# Patient Record
Sex: Female | Born: 1990 | Race: Black or African American | Hispanic: No | Marital: Married | Smoking: Never smoker
Health system: Southern US, Community
[De-identification: ages and names within clinical notes are randomized; demographics above are authoritative.]

## PROBLEM LIST (undated history)

## (undated) ENCOUNTER — Inpatient Hospital Stay (HOSPITAL_COMMUNITY): Payer: Self-pay

## (undated) DIAGNOSIS — N83209 Unspecified ovarian cyst, unspecified side: Secondary | ICD-10-CM

## (undated) DIAGNOSIS — O1490 Unspecified pre-eclampsia, unspecified trimester: Secondary | ICD-10-CM

## (undated) DIAGNOSIS — O139 Gestational [pregnancy-induced] hypertension without significant proteinuria, unspecified trimester: Secondary | ICD-10-CM

## (undated) DIAGNOSIS — A749 Chlamydial infection, unspecified: Secondary | ICD-10-CM

## (undated) DIAGNOSIS — B999 Unspecified infectious disease: Secondary | ICD-10-CM

## (undated) HISTORY — DX: Unspecified pre-eclampsia, unspecified trimester: O14.90

## (undated) HISTORY — PX: WISDOM TOOTH EXTRACTION: SHX21

---
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
## 2009-10-16 ENCOUNTER — Emergency Department (HOSPITAL_COMMUNITY): Admission: EM | Admit: 2009-10-16 | Discharge: 2009-10-16 | Payer: Self-pay | Admitting: Emergency Medicine

## 2009-10-28 ENCOUNTER — Emergency Department (HOSPITAL_COMMUNITY): Admission: EM | Admit: 2009-10-28 | Discharge: 2009-10-28 | Payer: Self-pay | Admitting: Emergency Medicine

## 2010-03-08 ENCOUNTER — Emergency Department (HOSPITAL_COMMUNITY): Admission: EM | Admit: 2010-03-08 | Discharge: 2010-03-08 | Payer: Self-pay | Admitting: Emergency Medicine

## 2010-04-29 ENCOUNTER — Emergency Department (HOSPITAL_COMMUNITY): Admission: EM | Admit: 2010-04-29 | Discharge: 2010-04-29 | Payer: Self-pay | Admitting: Family Medicine

## 2010-04-30 ENCOUNTER — Emergency Department (HOSPITAL_COMMUNITY): Admission: EM | Admit: 2010-04-30 | Discharge: 2010-04-30 | Payer: Self-pay | Admitting: Emergency Medicine

## 2010-05-17 ENCOUNTER — Emergency Department (HOSPITAL_COMMUNITY): Admission: EM | Admit: 2010-05-17 | Discharge: 2010-05-17 | Payer: Self-pay | Admitting: Family Medicine

## 2010-05-27 ENCOUNTER — Emergency Department (HOSPITAL_COMMUNITY): Admission: EM | Admit: 2010-05-27 | Discharge: 2010-05-27 | Payer: Self-pay | Admitting: Emergency Medicine

## 2010-08-21 ENCOUNTER — Emergency Department (HOSPITAL_COMMUNITY): Admission: EM | Admit: 2010-08-21 | Discharge: 2010-08-21 | Payer: Self-pay | Admitting: Emergency Medicine

## 2010-08-31 IMAGING — CT CT HEAD W/O CM
1 series · 16 of 30 positions shown, 20 images · non-contrast
Comparison: None available.

CLINICAL DATA: Headache.

CT HEAD WITHOUT CONTRAST
TECHNIQUE: Contiguous axial images were obtained from the base of
the skull through the vertex without contrast.

[Series 2: head routine 4.8 h37s · axial · 0.42mm/px · z∈[-170,-35]mm · 16 of 30 slices shown, 20 images]
[im 2/30  brain]
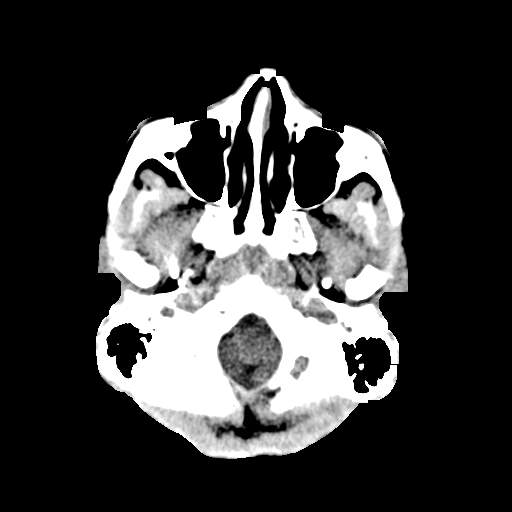
[im 2/30  bone]
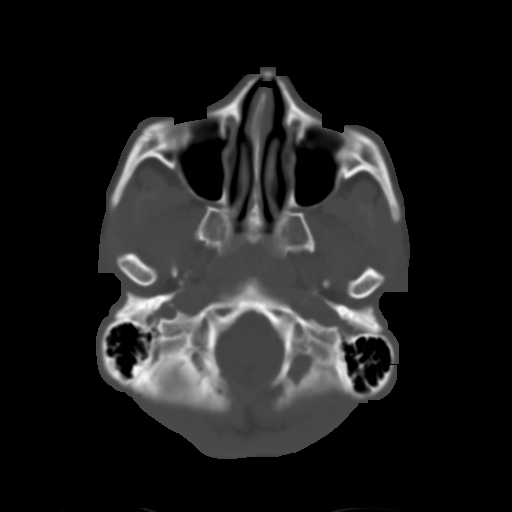
[im 4/30  brain]
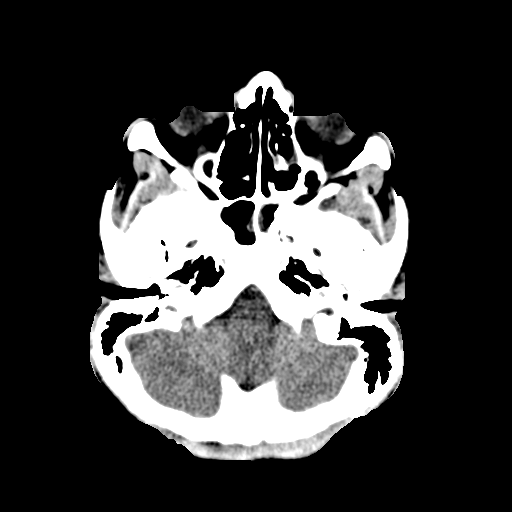
[im 6/30  brain]
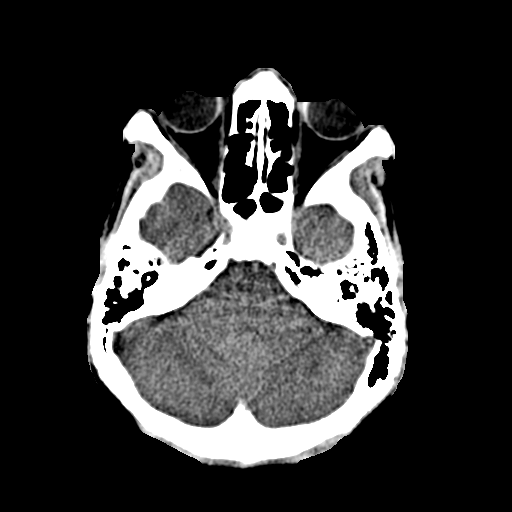
[im 8/30  brain]
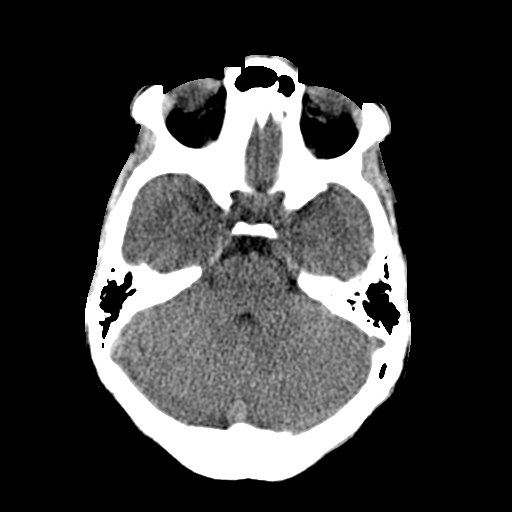
[im 9/30  brain]
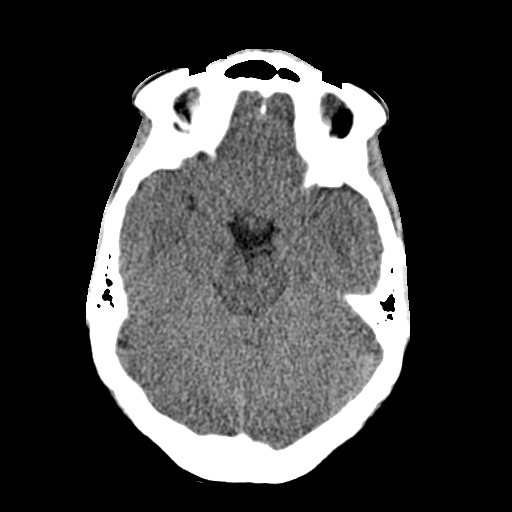
[im 9/30  bone]
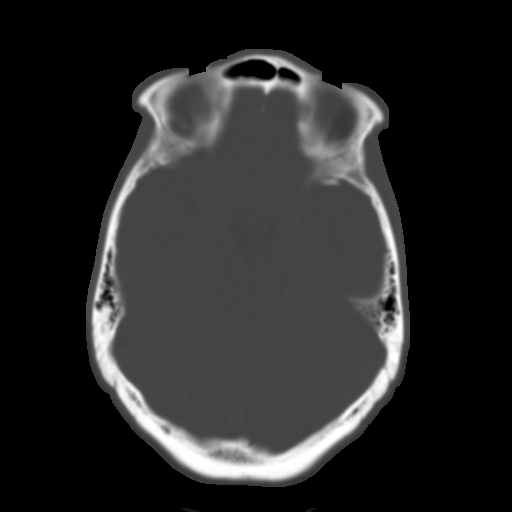
[im 11/30  brain]
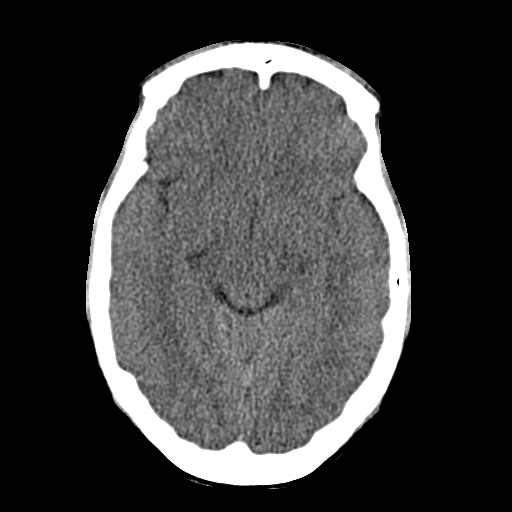
[im 13/30  brain]
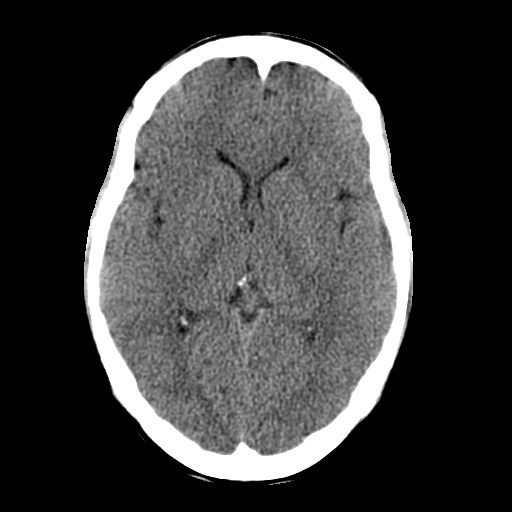
[im 15/30  brain]
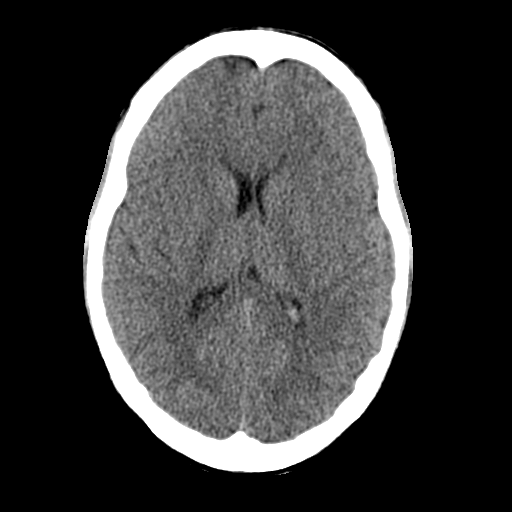
[im 16/30  brain]
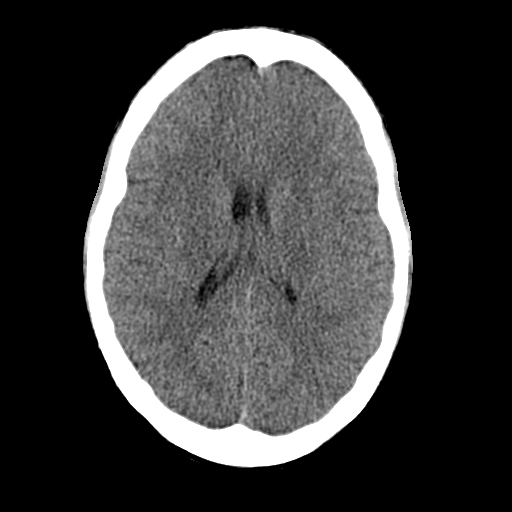
[im 16/30  bone]
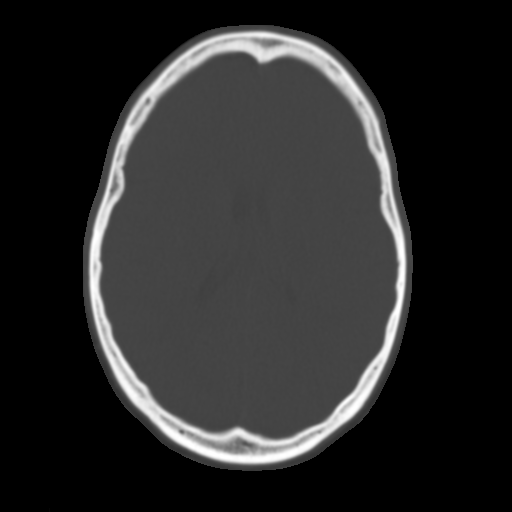
[im 18/30  brain]
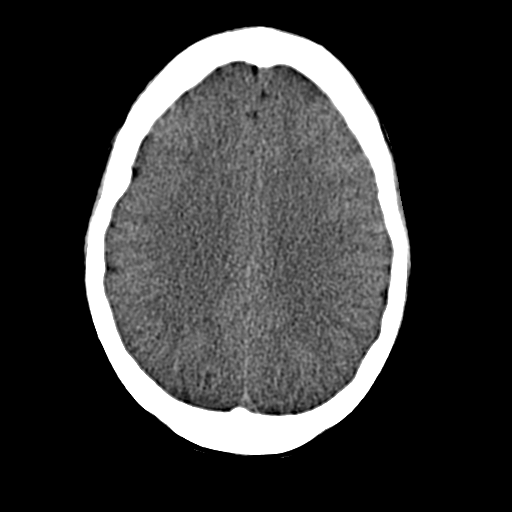
[im 20/30  brain]
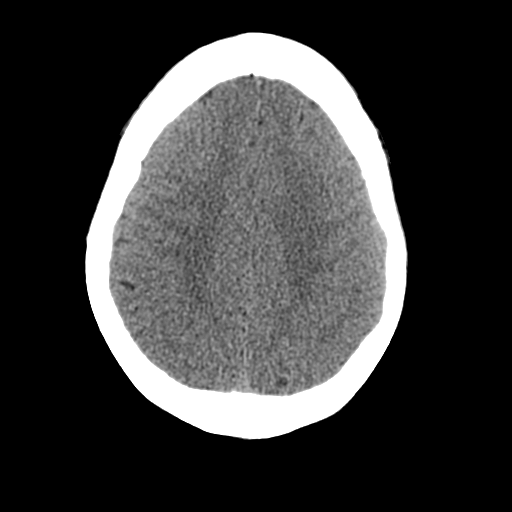
[im 22/30  brain]
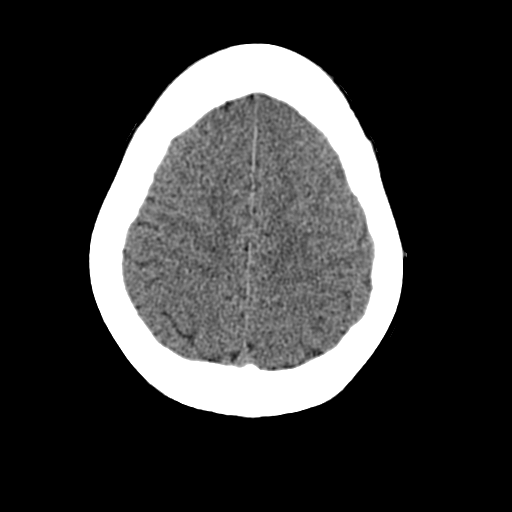
[im 23/30  brain]
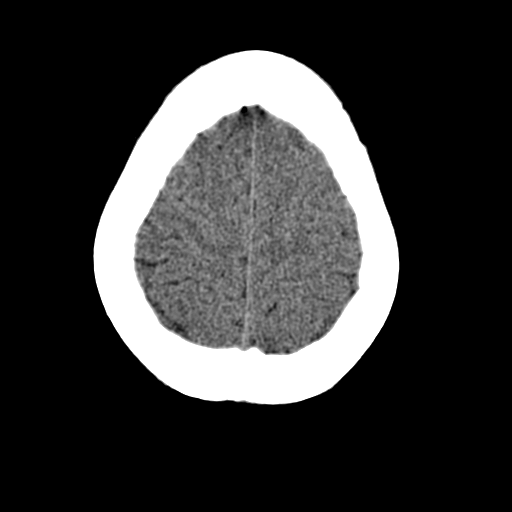
[im 23/30  bone]
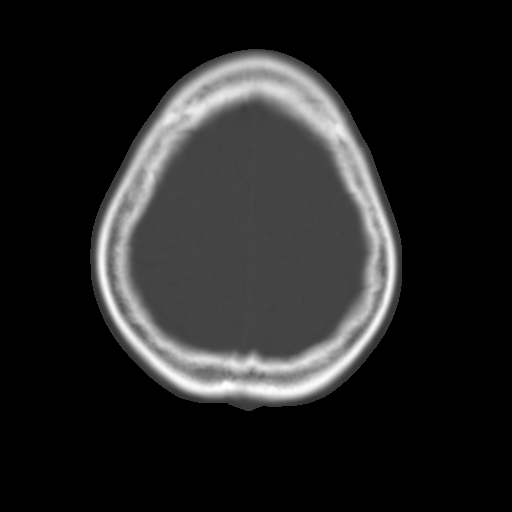
[im 25/30  brain]
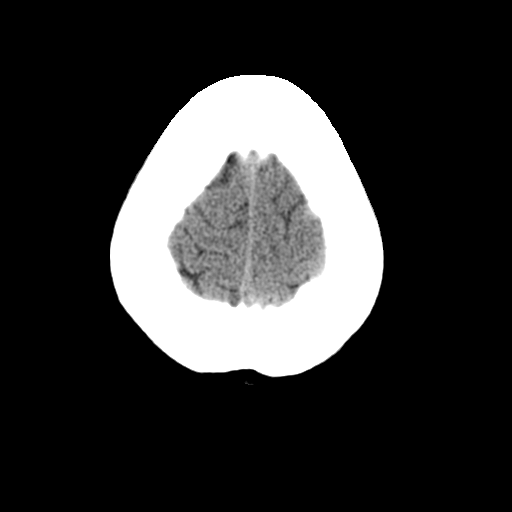
[im 27/30  brain]
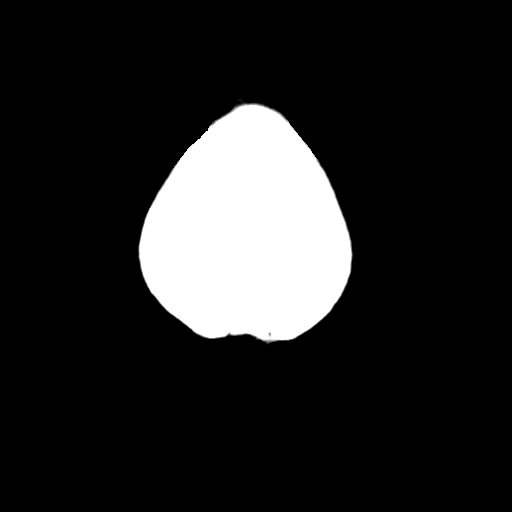
[im 29/30  brain]
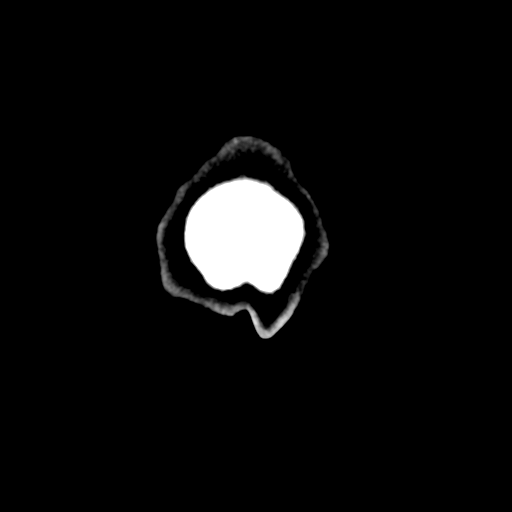

[16 of 30 positions shown; findings below may reference images not displayed]

FINDINGS: The brain appears normal without evidence of acute
infarction, hemorrhage, mass lesion, mass effect, midline shift or
abnormal extra-axial fluid collection.  There is some mucosal
thickening in both sphenoid sinuses and scattered ethmoid air
cells.  Imaged paranasal sinuses otherwise clear.  Calvarium
intact.
IMPRESSION: 1.  No acute intracranial abnormality.
2.  Mucosal thickening bilateral sphenoid sinuses and scattered
ethmoid air cells.

## 2011-02-24 LAB — POCT URINALYSIS DIP (DEVICE)
Bilirubin Urine: NEGATIVE
Ketones, ur: NEGATIVE mg/dL
Protein, ur: 100 mg/dL — AB
Specific Gravity, Urine: 1.02 (ref 1.005–1.030)
pH: 8.5 — ABNORMAL HIGH (ref 5.0–8.0)

## 2011-02-24 LAB — WET PREP, GENITAL
Trich, Wet Prep: NONE SEEN
Yeast Wet Prep HPF POC: NONE SEEN

## 2011-02-24 LAB — GC/CHLAMYDIA PROBE AMP, GENITAL: GC Probe Amp, Genital: NEGATIVE

## 2011-03-12 LAB — URINALYSIS, ROUTINE W REFLEX MICROSCOPIC
Glucose, UA: NEGATIVE mg/dL
Hgb urine dipstick: NEGATIVE
Protein, ur: NEGATIVE mg/dL
Specific Gravity, Urine: 1.025 (ref 1.005–1.030)
pH: 6 (ref 5.0–8.0)

## 2011-03-12 LAB — POCT RAPID STREP A (OFFICE): Streptococcus, Group A Screen (Direct): NEGATIVE

## 2011-03-12 LAB — URINE MICROSCOPIC-ADD ON

## 2011-03-12 LAB — POCT PREGNANCY, URINE: Preg Test, Ur: POSITIVE

## 2011-03-24 LAB — COMPREHENSIVE METABOLIC PANEL
AST: 27 U/L (ref 0–37)
CO2: 27 mEq/L (ref 19–32)
Calcium: 7.1 mg/dL — ABNORMAL LOW (ref 8.4–10.5)
Creatinine, Ser: 0.69 mg/dL (ref 0.4–1.2)
GFR calc Af Amer: >60 mL/min (ref >60)
GFR calc non Af Amer: >60 mL/min (ref >60)
Sodium: 138 mEq/L (ref 135–145)
Total Protein: 5.4 g/dL — ABNORMAL LOW (ref 6.0–8.3)

## 2011-03-24 LAB — CBC
MCHC: 35.1 g/dL (ref 30.0–36.0)
MCV: 91.6 fL (ref 78.0–100.0)
RBC: 3.26 MIL/uL — ABNORMAL LOW (ref 3.87–5.11)
RDW: 14.4 % (ref 11.5–15.5)

## 2011-03-24 LAB — URIC ACID: Uric Acid, Serum: 7.2 mg/dL — ABNORMAL HIGH (ref 2.4–7.0)

## 2011-03-24 LAB — LACTATE DEHYDROGENASE: LDH: 227 U/L (ref 94–250)

## 2011-04-22 NOTE — H&P (Signed)
NAMESIONA, COULSTON             ACCOUNT NO.:  [id number]   MEDICAL RECORD NO.:  [id number]          PATIENT TYPE:  INP   LOCATION:  9163                          FACILITY:  [facility]   PHYSICIAN:  Crist Fat. Rivard, M.D. DATE OF BIRTH:  05-07-1991   DATE OF ADMISSION:  12/07/2008  DATE OF DISCHARGE:                              HISTORY & PHYSICAL   Ms. Mclouth is an 20 year old single black female, gravida 2, para 0-0-1-  0, at 37-5/7 weeks per an [facility] of December 23, 2008, who presents for  direct admission for induction of labor secondary to preeclampsia.  She  was seen in the office today by Dr. Stefano Gaul and had blood pressures of  systolics 140s-150s over diastolics of 90s at the office and had 2+  protein on a cath urinalysis.  She denies headache, visual disturbances,  right upper quadrant or epigastric pain, denies contractions, leakage of  fluid, vaginal bleeding or other complaints.   HISTORY:  Remarkable for the following:  1. Teen.  2. GBS positive.  3. First trimester Chlamydia.  4. Questionable ovarian cyst with her first pregnancy.   OBSTETRICAL HISTORY:  Gravida 1:  Spontaneous abortion 6 weeks'  gestation, June 2008, no complications, passed naturally.  Gravida 2 is  current pregnancy.   PAST MEDICAL HISTORY:  Allergies:  She denies medication or latex  allergy or other sensitivities.  Contraception:  She has used birth  control pills and condoms in the past.  Prior to her prenatal care, she  had never had a Pap smear before.  She did report a questionable cyst on  her ovary with previous pregnancy, question if that was corpus luteal  versus other.  She has had bacterial vaginosis.  She was treated first  trimester for Chlamydia.  She has had occasional UTI in the past.  Genetic history:  Remarkable for the fact patient's dad had an extra  finger.  The patient is 15.  Father of the baby is 26.   FAMILY HISTORY:  Father is chronic hypertensive on medication.   Maternal  grandmother has COPD.  Father type 2 diabetic and dad with drug  addiction.   SOCIAL HISTORY:  Single black female.  She is of [place] faith.  Name  of the baby's father is Alinda Sierras.  Patient has high school education,  unemployed.  Father of baby has 10th grade education, unemployed.  She  denies alcohol, tobacco or illicit drug use.   HISTORY OF PRESENT PREGNANCY:  She entered care June 29, 2008, for her  new OB interview approximately 14 weeks 6 days.  Her pregravid weight  was 135 pounds.  She is 138 pounds at her new OB workup, and her height  is 5 feet 3-1/2 inches.  Her BMI is normal at 23.5.  She declined quad  screen.  She had had some prenatal care prior (OB at [facility]).  She did have prenatal labs drawn that day.  Prenatal labs include a blood type of O positive, Rh antibody screen  negative, sickle cell negative, RPR nonreactive, rubella titer immune,  hepatitis surface  antigen negative, HIV nonreactive, Pap smear July 04, 2008, within normal limits.  Gonorrhea and Chlamydia cultures on the  same day, July 04, 2008:  Gonorrhea was negative.  Chlamydia was  positive.  She had hemoglobin at that time of 12.1, hematocrit 34.3 and  platelets 268.  She was treated for the Chlamydia, had at 19 and 3/7  weeks an ultrasound that suggested female, no anomalies.  She had test  of cure that day for the gonorrhea and was negative.  She did report her  partner was treated.  At 27 and 3/7 weeks, she did have her 1-hour GTT  which was within normal limits equal to 97.  Hemoglobin at that time, it  looks like, was 10.5.  She was called in some iron for the anemia, had  H1N1 at 31 and 5/7 weeks on October 26, 2008.  She was evaluated for  UTI around 33 weeks.  GC/Chlamydia cultures as well as GBS were done at  35 and 2/7 weeks.  Wet prep was positive for yeast infection, and she  was prescribed Terazol 3 and was also prescribed Keflex for a UTI  at  that same time at 35 weeks.  Blood pressure was slightly elevated at  130/90, and then a repeat was 128/92.  PIH precautions were given to  patient at the time, and she was to return for blood pressure check.  That was on November 20, 2008.  She also did a 24-hour urine.  Those  results are unavailable.  She said that her blood pressures have been  running high ever since that time, kind of up and down, until the 2+  protein today and high blood pressure in the office as well today.   OBJECTIVE:  VITAL SIGNS:  Blood pressure since admission to the hospital  151/95, 153/106 and lastly 133/81.  Her heart rate is 54-65,  respirations 20 and temperature 98.4.  Fetal heart rate 130s, some 10 x  10 accelerations but not yet reactive, moderate variability, some brief  mild abrupt variables.  Toco:  She is without contractions or uterine  irritability.  GENERAL:  No acute distress.  She is alert and oriented x3, soft spoken.  HEENT:  Grossly intact and within normal limits.  She does have  contacts.  SKIN:  Warm, dry and intact.  She does have some tattoos on her back as  well as her right thigh.  She does have a nose ring.  CARDIOVASCULAR:  Regular rate and rhythm without murmur.  LUNGS:  Clear to auscultation bilaterally.  ABDOMEN:  Soft, nontender and gravid.  She has no right upper quadrant  pain or tenderness.  PELVIC:  Cervix is 2+, 75%, -2.  It is mid-position and soft.  EXTREMITIES:  Mild generalized edema, no clonus, and DTRs are 1+.   CBC:  White count 7, hemoglobin 11.7, hematocrit 35 and platelets 227.  CMET:  LDH and uric acid are pending.   IMPRESSION:  1. Intrauterine pregnancy at 37 and 5/7 weeks.  2. Preeclampsia.  3. Reassuring fetal heart tracing.  4. Group beta Strep positive.   PLAN:  1. Admit to birthing suite for Dr. Estanislado Pandy as attending physician.  2. Routine orders with [facility] labs.  3. Magnesium sulfate with a 4 gram bolus, then 2 grams per hour and       Penicillin G IV for GBS prophylaxis.  4. After consulting with Dr. Estanislado Pandy with cervical exam as well as  fetal heart tracing, plan is to have 1-2 doses of Cytotec tonight,      first dose after her magnesium bolus and then begin Pitocin IV in      early a.m., and patient may have epidural p.r.n.  5. MD is to follow.     Candice [place], CNM      Dois Davenport A. Rivard, M.D.  Electronically Signed   CHS/MEDQ  D:  12/07/2008  T:  12/07/2008  Job:  [id number]

## 2011-04-22 NOTE — Discharge Summary (Signed)
NAMEHA, SHANNAHAN             ACCOUNT NO.:  [id number]   MEDICAL RECORD NO.:  [id number]          PATIENT TYPE:  INP   LOCATION:  9116                          [facility]ACILITY:  [facility]   PHYSICIAN:  Osborn Coho, M.D.   DATE OF BIRTH:  Jun 30, 1991   DATE OF ADMISSION:  12/07/2008  DATE OF DISCHARGE:  12/10/2008                               DISCHARGE SUMMARY   ADMITTING DIAGNOSES:  1. Intrauterine pregnancy at 80 and 5/7th weeks.  2. Preeclampsia.  3. Reassuring fetal heart rate.  4. Group B streptococcus positive.   DISCHARGE DIAGNOSES:  1. Intrauterine pregnancy at 20 and 5/7th weeks.  2. Preeclampsia.  3. Reassuring fetal heart rate.  4. Group B streptococcus positive.  5. Mild uterine postpartum atony.   PROCEDURES:  1. Normal spontaneous vaginal birth.  2. Repair of second-degree laceration.  3. Repair of periurethral tear.  4. Magnesium sulfate therapy.   HOSPITAL COURSE:  Tina Mays is an 20 year old gravida 2, para 0-0-1-0  at 42 and 5/7th weeks who was admitted on the afternoon of December 07, 2008, as an induction for preeclampsia.  Her blood pressures were in the  140s-150s/90s at the office and 2+ protein on a cath specimen.  She  denied any headache, visual symptoms, or epigastric pain.  Pregnancy had  been remarkable for:  1. Teen pregnancy.  2. Group B strep positive.  3. First-trimester Chlamydia.  4. Questionable ovarian cyst during her first pregnancy.  5. Preeclampsia.   PHYSICAL EXAMINATION:  VITAL SIGNS:  On admission, blood pressures were  in the 150s/80s-106.  Other vital signs were stable.  PELVIC:  She was having reassuring fetal heart rate tracing, some brief  mild variables.  Weight was 84.1 kg.   LABORATORY DATA:  CBC was within normal limits with a hemoglobin of  11.7, hematocrit of 35, white blood cell count of 7, and platelet count  of 227.  The preeclampsia labs were within normal limits with normal  LFTs.  Uric acid was 6.9.   The  patient was begun on magnesium sulfate and Cytotec was placed for  cervical ripening.  The cervix was 275%, vertex, -2.  She began to  contract well and was in active labor fairly quickly.  She was complete  at 4:44 a.m. on December 08, 2008, and delivered at 5:53 a.m. a viable  female by the name of Tina Mays, Apgars 8 and 9, weight was 6 pounds 8  ounces.  There was second-degree laceration and a periurethral tear that  were both repaired in the usual fashion.  She had mild uterine atony  postpartum, which responded to increasing her Pitocin by manual massage  and Cytotec 1000 mcg per rectum.  Estimated blood loss was 700 mL.  Placenta was sent to Pathology.  Infant was taken to the full-term  nursery.  Mother was remained in birthing suite for 24 hours with  magnesium sulfate running at 2 grams per hour.  By the morning of  December 09, 2008 postpartum day #1, the patient was doing well.  She was  having no headache, visual symptoms, or epigastric pain.  She was  planning on breast-feeding.  Her blood pressures were in the 130s/70s-  80s.  Other vital signs were stable.  Fundus was firm.  Lochia was  scant.  Perineum was healing.  Hemoglobin was 10.5-11.7, white blood  cell count was 8.4, and platelet count was 216.  Comprehensive metabolic  panel was within normal limits.  Uric acid was 7.2.  Magnesium sulfate  level was 5.5.  Magnesium was discontinued at 24 hours after delivery.  The patient was then observed in birthing suite for 4 hours and then  transferred to the floor.  By postpartum day #2, on December 10, 2008, the  patient was continuing to do well.  The nursery was planning an  ultrasound on the baby on December 11, 2008, secondary to a deep cycle  temple, so they would make the baby the patient.  The patient, however  denied any headache, visual symptoms, and epigastric pain.  She was  breast-feeding.  Her blood pressures were in the 120s-140s/75-90 with  most of the diastolic values  in the 70s-80s.  Her physical exam was  within normal limits.  Her deep tendon reflexes were 1+ without clonus.  There was trace edema.  She was deemed to receive full benefit of her  hospital stay.  She was undecided regarding contraception and she was  discharged as the patient, although the baby was going to remain the  patient.   DISCHARGE INSTRUCTIONS:  Per [facility] handout.  PIH  precautions were also reviewed with the patient.   DISCHARGE MEDICATIONS:  1. Motrin 600 mg p.o. q.6 h. p.r.n. pain.  2. Percocet 5/325 one to two p.o. q.3-4h. p.r.n. pain.   DISCHARGE FOLLOWUP:  Will occur by [facility] [employer] nurse on December 13, 2008  or December 14, 2008 for blood pressure and the office will be notified  with that result.  Then, we will also see her in 6 weeks postpartum or  p.r.n.      Renaldo Reel Tina Mays, C.N.M.      Osborn Coho, M.D.  Electronically Signed    VLL/MEDQ  D:  12/10/2008  T:  12/10/2008  Job:  [id number]

## 2011-06-21 ENCOUNTER — Inpatient Hospital Stay (INDEPENDENT_AMBULATORY_CARE_PROVIDER_SITE_OTHER)
Admission: RE | Admit: 2011-06-21 | Discharge: 2011-06-21 | Disposition: A | Discharge status: Home / Self Care | Payer: Self-pay | Source: Ambulatory Visit | Attending: Family Medicine | Admitting: Family Medicine

## 2011-06-21 DIAGNOSIS — J029 Acute pharyngitis, unspecified: Secondary | ICD-10-CM

## 2011-06-21 LAB — POCT RAPID STREP A: Streptococcus, Group A Screen (Direct): NEGATIVE

## 2011-08-29 LAB — POCT URINALYSIS DIP (DEVICE)
Nitrite: NEGATIVE
Protein, ur: 30 — AB
Specific Gravity, Urine: 1.015
Urobilinogen, UA: 0.2

## 2011-08-29 LAB — POCT PREGNANCY, URINE: Preg Test, Ur: NEGATIVE

## 2011-09-12 LAB — RPR: RPR Ser Ql: NONREACTIVE

## 2011-09-12 LAB — COMPREHENSIVE METABOLIC PANEL
AST: 22 U/L (ref 0–37)
Albumin: 2.8 g/dL — ABNORMAL LOW (ref 3.5–5.2)
Calcium: 9.3 mg/dL (ref 8.4–10.5)
Creatinine, Ser: 0.61 mg/dL (ref 0.4–1.2)
GFR calc Af Amer: >60 mL/min (ref >60)
GFR calc non Af Amer: >60 mL/min (ref >60)
Total Protein: 6.6 g/dL (ref 6.0–8.3)

## 2011-09-12 LAB — CBC
MCHC: 33.5 g/dL (ref 30.0–36.0)
MCV: 91.8 fL (ref 78.0–100.0)
Platelets: 227 10*3/uL (ref 150–400)
RDW: 13.7 % (ref 11.5–15.5)

## 2011-09-12 LAB — URIC ACID: Uric Acid, Serum: 6.9 mg/dL (ref 2.4–7.0)

## 2011-09-23 LAB — URINE MICROSCOPIC-ADD ON

## 2011-09-23 LAB — CBC
HCT: 33.2 — ABNORMAL LOW
MCHC: 33.5
MCV: 88.2
Platelets: 309
WBC: 5.2

## 2011-09-23 LAB — HCG, QUANTITATIVE, PREGNANCY: hCG, Beta Chain, Quant, S: 303 — ABNORMAL HIGH

## 2011-09-23 LAB — GC/CHLAMYDIA PROBE AMP, GENITAL
Chlamydia, DNA Probe: NEGATIVE
GC Probe Amp, Genital: NEGATIVE

## 2011-09-23 LAB — POCT PREGNANCY, URINE: Preg Test, Ur: POSITIVE

## 2011-09-23 LAB — WET PREP, GENITAL
Clue Cells Wet Prep HPF POC: NONE SEEN
Trich, Wet Prep: NONE SEEN

## 2011-09-23 LAB — URINALYSIS, ROUTINE W REFLEX MICROSCOPIC
Bilirubin Urine: NEGATIVE
Glucose, UA: NEGATIVE
Ketones, ur: 15 — AB
Protein, ur: 100 — AB
Urobilinogen, UA: 0.2

## 2012-12-08 NOTE — L&D Delivery Note (Signed)
Delivery Note At 8:12 AM a viable female was delivered via  (Presentation: ROA ).   Placenta status: delivered with cord traction intact, .  Cord: nuchal x 1; clamped/cut prior to delivery of the anterior shoulder  Anesthesia:  Epidural/local Episiotomy: none Lacerations: second degree Suture Repair: 2.0 3.0 vicryl rapide Est. Blood Loss (mL): 250 ml  Mom to postpartum.  Baby to Couplet care / Skin to Skin.  JACKSON-MOORE,Leen Tworek A 11/14/2013, 8:35 AM

## 2013-05-11 ENCOUNTER — Encounter: Payer: Self-pay | Admitting: Obstetrics & Gynecology

## 2013-07-04 ENCOUNTER — Ambulatory Visit (INDEPENDENT_AMBULATORY_CARE_PROVIDER_SITE_OTHER): Payer: Medicaid Other | Admitting: Obstetrics & Gynecology

## 2013-07-04 ENCOUNTER — Encounter: Payer: Self-pay | Admitting: Obstetrics & Gynecology

## 2013-07-04 VITALS — BP 117/80 | Temp 97.7°F | Ht 64.0 in | Wt 167.6 lb

## 2013-07-04 DIAGNOSIS — Z3482 Encounter for supervision of other normal pregnancy, second trimester: Secondary | ICD-10-CM

## 2013-07-04 DIAGNOSIS — Z3480 Encounter for supervision of other normal pregnancy, unspecified trimester: Secondary | ICD-10-CM

## 2013-07-04 LAB — POCT URINALYSIS DIPSTICK
Bilirubin, UA: NEGATIVE
Blood, UA: NEGATIVE
Glucose, UA: NEGATIVE
Leukocytes, UA: NEGATIVE
Nitrite, UA: NEGATIVE
Urobilinogen, UA: NEGATIVE

## 2013-07-04 NOTE — Progress Notes (Signed)
Pulse- 66 . Subjective:    Tina Mays is being seen today for her first obstetrical visit.  This is a planned pregnancy. She is at [redacted]w[redacted]d gestation. Her obstetrical history is significant for pre-eclampsia. Relationship with FOB: significant other, living together. Patient does intend to breast feed. Pregnancy history fully reviewed.  Menstrual History: OB History   Grav Para Term Preterm Abortions TAB SAB Ect Mult Living   4 1 1  2  1   1       Menarche age: 49 Patient's last menstrual period was 02/27/2013.    The following portions of the patient's history were reviewed and updated as appropriate: allergies, current medications, past family history, past medical history, past social history, past surgical history and problem list.  Review of Systems Pertinent items are noted in HPI.    Objective:      General Appearance:    Alert, cooperative, no distress, appears stated age  Head:    Normocephalic, without obvious abnormality, atraumatic  Eyes:    PERRL, conjunctiva/corneas clear, EOM's intact, fundi    benign, both eyes  Ears:    Normal TM's and external ear canals, both ears  Nose:   Nares normal, septum midline, mucosa normal, no drainage    or sinus tenderness  Throat:   Lips, mucosa, and tongue normal; teeth and gums normal  Neck:   Supple, symmetrical, trachea midline, no adenopathy;    thyroid:  no enlargement/tenderness/nodules; no carotid   bruit or JVD  Back:     Symmetric, no curvature, ROM normal, no CVA tenderness  Lungs:     Clear to auscultation bilaterally, respirations unlabored  Chest Wall:    No tenderness or deformity   Heart:    Regular rate and rhythm, S1 and S2 normal, no murmur, rub   or gallop  Breast Exam:    No tenderness, masses, or nipple abnormality  Abdomen:     Soft, non-tender, bowel sounds active all four quadrants,    no masses, no organomegaly  Genitalia:    Normal female without lesion, discharge or tenderness  Extremities:    Extremities normal, atraumatic, no cyanosis or edema  Pulses:   2+ and symmetric all extremities  Skin:   Skin color, texture, turgor normal, no rashes or lesions  Lymph nodes:   Cervical, supraclavicular, and axillary nodes normal  Neurologic:   CNII-XII intact, normal strength, sensation and reflexes    throughout    Assessment:    Pregnancy at [redacted]w[redacted]d weeks    Plan:    Initial labs drawn. Prenatal vitamins.  Counseling provided regarding continued use of seat belts, cessation of alcohol consumption, smoking or use of illicit drugs; infection precautions i.e., influenza/TDAP immunizations, toxoplasmosis,CMV, parvovirus, listeria and varicella; workplace safety, exercise during pregnancy; routine dental care, safe medications, sexual activity, hot tubs, saunas, pools, travel, caffeine use, fish and methlymercury, potential toxins, hair treatments, varicose veins Weight gain recommendations reviewed: underweight/BMI< 18.5--> gain 28 - 40 lbs; normal weight/BMI 18.5 - 24.9--> gain 25 - 35 lbs; overweight/BMI 25 - 29.9--> gain 15 - 25 lbs; obese/BMI >30->gain  11 - 20 lbs Problem list reviewed and updated. AFP3 discussed: requested. Role of ultrasound in pregnancy discussed; fetal survey: requested. Amniocentesis discussed: not indicated. Follow up in 4 weeks. 50% of 20 min visit spent on counseling and coordination of care.

## 2013-07-04 NOTE — Patient Instructions (Signed)
AFP Maternal This is a routine screen (tests) used to check for fetal abnormalities such as Down syndrome and neural tube defects. Down Syndrome is a chromosomal abnormality, sometimes called Trisomy 21. Neural tube defects are serious birth defects. The brain, spinal cord, or their coverings do not develop completely. Women should be tested in the 15th to 20th week of pregnancy. The msAFP screen involves three or four tests that measure substances found in the blood that make the testing better. During development, AFP levels in fetal blood and amniotic fluid rise until about 12 weeks. The levels then gradually fall until birth. AFP is a protein produce by fetal tissue. AFP crosses the placenta and appears in the maternal blood. A baby with an open neural tube defect has an opening in its spine, head, or abdominal wall that allows higher-than-usual amounts of AFP to pass into the mother's blood. If a screen is positive, more tests are needed to make a diagnosis. These include ultrasound and perhaps amniocentesis (checking the fluid that surrounds the baby). These tests are used to help women and their caregivers make decisions about the management of their pregnancies. In pregnancies where the fetus is carrying the chromosomal defect that results in Down syndrome, the levels of AFP and unconjugated estriol tend to be low and hCG and inhibin A levels high.  PREPARATION FOR TEST Blood is drawn from a vein in your arm usually between the 15th and 20th weeks of pregnancy. Four different tests on your blood are done. These are AFP, hCG, unconjugated estriol, and inhibin A. The combination of tests produces a more accurate result. NORMAL FINDINGS   Adult: less than 40ng/mL or less than 40 mg/L (SI units)  Child younger than1 year: less than 30 ng/mL Ranges are stratified by weeks of gestation and vary among laboratories. Ranges for normal findings may vary among different laboratories and hospitals. You  should always check with your doctor after having lab work or other tests done to discuss the meaning of your test results and whether your values are considered within normal limits. MEANING OF TEST  These are screening tests. Not all fetal abnormalities will give positive test results. Of all women who have positive AFP screening results, only a very small number of them have babies who actually have a neural tube defect or chromosomal abnormality. Your caregiver will go over the test results with you and discuss the importance and meaning of your results, as well as treatment options and the need for additional tests if necessary. OBTAINING THE TEST RESULTS It is your responsibility to obtain your test results. Ask the lab or department performing the test when and how you will get your results. Document Released: 12/16/2004 Document Revised: 02/16/2012 Document Reviewed: 10/28/2008 [employer] Patient Information 2014 [employer].  

## 2013-07-05 ENCOUNTER — Other Ambulatory Visit: Payer: Self-pay | Admitting: *Deleted

## 2013-07-05 DIAGNOSIS — Z3482 Encounter for supervision of other normal pregnancy, second trimester: Secondary | ICD-10-CM

## 2013-07-05 LAB — GC/CHLAMYDIA PROBE AMP
CT Probe RNA: NEGATIVE
GC Probe RNA: NEGATIVE

## 2013-07-05 LAB — OBSTETRIC PANEL
Antibody Screen: NEGATIVE
Basophils Relative: 0 % (ref 0–1)
Eosinophils Absolute: 0.1 10*3/uL (ref 0.0–0.7)
Eosinophils Relative: 1 % (ref 0–5)
Hemoglobin: 11.2 g/dL — ABNORMAL LOW (ref 12.0–15.0)
Hepatitis B Surface Ag: NEGATIVE
Lymphs Abs: 1.5 10*3/uL (ref 0.7–4.0)
MCH: 29.6 pg (ref 26.0–34.0)
MCHC: 35 g/dL (ref 30.0–36.0)
MCV: 84.4 fL (ref 78.0–100.0)
Monocytes Relative: 11 % (ref 3–12)
Neutrophils Relative %: 59 % (ref 43–77)
RBC: 3.79 MIL/uL — ABNORMAL LOW (ref 3.87–5.11)
Rh Type: POSITIVE

## 2013-07-05 LAB — CULTURE, OB URINE
Colony Count: NO GROWTH
Organism ID, Bacteria: NO GROWTH

## 2013-07-05 LAB — HIV ANTIBODY (ROUTINE TESTING W REFLEX): HIV: NONREACTIVE

## 2013-07-06 LAB — HEMOGLOBINOPATHY EVALUATION
Hgb A2 Quant: 3 % (ref 2.2–3.2)
Hgb A: 97 % (ref 96.8–97.8)

## 2013-07-06 LAB — AFP, QUAD SCREEN
AFP: 39 IU/mL
Curr Gest Age: 18.1 wks.days
INH: 255.5 pg/mL
Interpretation-AFP: POSITIVE — AB
Osb Risk: 1:32700 {titer}
Trisomy 18 (Edward) Syndrome Interp.: <1:28000 {titer}
uE3 Value: 0.5 ng/mL

## 2013-07-12 ENCOUNTER — Other Ambulatory Visit: Payer: Medicaid Other

## 2013-07-12 ENCOUNTER — Ambulatory Visit (INDEPENDENT_AMBULATORY_CARE_PROVIDER_SITE_OTHER): Payer: Medicaid Other

## 2013-07-12 DIAGNOSIS — Z3482 Encounter for supervision of other normal pregnancy, second trimester: Secondary | ICD-10-CM

## 2013-07-12 DIAGNOSIS — Z3480 Encounter for supervision of other normal pregnancy, unspecified trimester: Secondary | ICD-10-CM

## 2013-07-13 ENCOUNTER — Encounter: Payer: Self-pay | Admitting: Obstetrics & Gynecology

## 2013-07-13 LAB — US OB DETAIL + 14 WK

## 2013-07-18 ENCOUNTER — Ambulatory Visit (INDEPENDENT_AMBULATORY_CARE_PROVIDER_SITE_OTHER): Payer: Medicaid Other | Admitting: Obstetrics & Gynecology

## 2013-07-18 ENCOUNTER — Other Ambulatory Visit: Payer: Self-pay | Admitting: Obstetrics & Gynecology

## 2013-07-18 ENCOUNTER — Encounter: Payer: Self-pay | Admitting: Obstetrics & Gynecology

## 2013-07-18 VITALS — BP 108/72 | Temp 98.4°F | Wt 169.4 lb

## 2013-07-18 DIAGNOSIS — Z3480 Encounter for supervision of other normal pregnancy, unspecified trimester: Secondary | ICD-10-CM

## 2013-07-18 DIAGNOSIS — Z3482 Encounter for supervision of other normal pregnancy, second trimester: Secondary | ICD-10-CM

## 2013-07-18 LAB — POCT URINALYSIS DIPSTICK
Bilirubin, UA: NEGATIVE
Blood, UA: NEGATIVE
Ketones, UA: NEGATIVE
Nitrite, UA: NEGATIVE
Protein, UA: NEGATIVE
pH, UA: 5

## 2013-07-18 NOTE — Progress Notes (Signed)
The quad screen results were reviewed.

## 2013-07-18 NOTE — Patient Instructions (Signed)
Glucose Tolerance Test This is a test to see how your body processes carbohydrates. This test is often done to check patients for diabetes or the possibility of developing it. PREPARATION FOR TEST You should have nothing to eat or drink 12 hours before the test. You will be given a form of sugar (glucose) and then blood samples will be drawn from your vein to determine the level of sugar in your blood. Alternatively, blood may be drawn from your finger for testing. You should not smoke or exercise during the test. NORMAL FINDINGS  Fasting: 70-115 mg/dL  30 minutes: less than 200 mg/dL  1 hour: less than 200 mg/dL  2 hours: less than 140 mg/dL  3 hours: 70-115 mg/dL  4 hours: 70-115 mg/dL Ranges for normal findings may vary among different laboratories and hospitals. You should always check with your doctor after having lab work or other tests done to discuss the meaning of your test results and whether your values are considered within normal limits. MEANING OF TEST Your caregiver will go over the test results with you and discuss the importance and meaning of your results, as well as treatment options and the need for additional tests. OBTAINING THE TEST RESULTS It is your responsibility to obtain your test results. Ask the lab or department performing the test when and how you will get your results. Document Released: 12/17/2004 Document Revised: 02/16/2012 Document Reviewed: 11/04/2008 [employer] Patient Information 2014 [employer].  

## 2013-07-18 NOTE — Progress Notes (Signed)
Pulse: 88

## 2013-07-19 DIAGNOSIS — O285 Abnormal chromosomal and genetic finding on antenatal screening of mother: Secondary | ICD-10-CM | POA: Insufficient documentation

## 2013-07-22 ENCOUNTER — Other Ambulatory Visit: Payer: Self-pay | Admitting: Obstetrics & Gynecology

## 2013-07-22 DIAGNOSIS — Z3689 Encounter for other specified antenatal screening: Secondary | ICD-10-CM

## 2013-07-22 DIAGNOSIS — O280 Abnormal hematological finding on antenatal screening of mother: Secondary | ICD-10-CM

## 2013-07-27 ENCOUNTER — Ambulatory Visit (HOSPITAL_COMMUNITY)
Admission: RE | Admit: 2013-07-27 | Discharge: 2013-07-27 | Disposition: A | Discharge status: Home / Self Care | Payer: Medicaid Other | Source: Ambulatory Visit | Attending: Obstetrics & Gynecology | Admitting: Obstetrics & Gynecology

## 2013-07-27 ENCOUNTER — Encounter (HOSPITAL_COMMUNITY): Payer: Self-pay

## 2013-07-27 DIAGNOSIS — O280 Abnormal hematological finding on antenatal screening of mother: Secondary | ICD-10-CM

## 2013-07-27 DIAGNOSIS — O351XX0 Maternal care for (suspected) chromosomal abnormality in fetus, not applicable or unspecified: Secondary | ICD-10-CM | POA: Insufficient documentation

## 2013-07-27 DIAGNOSIS — Z3689 Encounter for other specified antenatal screening: Secondary | ICD-10-CM

## 2013-07-27 DIAGNOSIS — O3510X0 Maternal care for (suspected) chromosomal abnormality in fetus, unspecified, not applicable or unspecified: Secondary | ICD-10-CM | POA: Insufficient documentation

## 2013-07-27 NOTE — Progress Notes (Signed)
Genetic Counseling  High-Risk Gestation Note  Appointment Date:  07/27/2013 Referred By: Antionette Char, MD Date of Birth:  July 14, 1991 Partner:  Tina Mays    Pregnancy History: [id number] Estimated Date of Delivery: 12/04/13 Estimated Gestational Age: [redacted]w[redacted]d Attending: Alpha Gula, MD   [name] and her partner, [name], were seen for genetic counseling because of an increased risk for fetal Down syndrome based on Quad screen performed through [facility].  They were counseled regarding the Quad screen result and the associated 1 in 135 risk for fetal Down syndrome.  We reviewed chromosomes, nondisjunction, and the common features and variable prognosis of Down syndrome.  In addition, we reviewed the screen adjusted reduction in risks for trisomy 18 (< 1 in 28,000) and ONTDs (1 in 32,700).  We also discussed other explanations for a screen positive result including: a gestational dating error, differences in maternal metabolism, and normal variation. They understand that this screening is not diagnostic for Down syndrome but provides a risk assessment.  We reviewed available screening options including noninvasive prenatal screening (NIPS)/cell free fetal DNA (cffDNA) testing, and detailed ultrasound.  They were counseled that screening tests are used to modify a patient's a priori risk for aneuploidy, typically based on age. This estimate provides a pregnancy specific risk assessment. We reviewed the benefits and limitations of each option. Specifically, we discussed the conditions for which each test screens, the detection rates, and false positive rates of each. They were also counseled regarding diagnostic testing via amniocentesis. We reviewed the approximate 1 in 300-500 risk for complications for amniocentesis, including spontaneous pregnancy loss.   After consideration of all the options, they elected to proceed with detailed ultrasound only and  declined NIPS and amniocentesis. A complete ultrasound was performed today. The ultrasound report will be sent under separate cover. There were no visualized fetal anomalies or markers suggestive of aneuploidy. Diagnostic testing was declined today.  They understand that screening tests cannot rule out all birth defects or genetic syndromes. The patient was advised of this limitation and states she still does not want additional testing at this time.   Tina Mays was provided with written information regarding sickle cell anemia (SCA) including the carrier frequency and incidence in the African-American population, the availability of carrier testing and prenatal diagnosis if indicated.  In addition, we discussed that hemoglobinopathies are routinely screened for as part of the [place] newborn screening panel. We discussed that hemoglobin electrophoresis was previously performed through her OB office and revealed the presence of normal adult hemoglobin. Thus, Tina Mays does not appear to have sickle cell trait or other hemoglobin variants.    Both family histories were reviewed and found to be noncontributory for birth defects, intellectual disability, recurrent miscarriage, or known genetic conditions. The father of the pregnancy did not have information regarding his father or his paternal family history. We thus cannot comment on how this history may contribute to the risk for a birth defect or genetic condition in the pregnancy. Without further information regarding the provided family history, an accurate genetic risk cannot be calculated. Further genetic counseling is warranted if more information is obtained.  Tina Mays denied exposure to environmental toxins or chemical agents. She denied the use of alcohol, tobacco or street drugs. She denied significant viral illnesses during the course of her pregnancy. Her medical and surgical histories were noncontributory.   I counseled this  couple for approximately 35 minutes regarding the above risks and  available options.   Quinn Plowman, MS,  Certified Genetic Counselor 07/27/2013

## 2013-07-27 NOTE — Progress Notes (Signed)
Jalacia L Seidel  was seen today for an ultrasound appointment.  See full report in [facility].  Comments: Ms. Wickstrom was seen today due to a positive quad screen for Trisomy 21 risk (1:135).  See separate note from [employer], [employer]film[employer]video.  Normal detailed fetal anatomy noted.  No markers associated with Down syndrome were appreciated.  After counseling, the patient declined further genetic testing (NIPS, amniocentesis)  Impression: Single IUP at 21 3/7 weeks Normal detailed fetal anatomy No markers associated with aneuploidy noted Normal amniotic fluid  Recommendations: Follow-up ultrasounds as clinically indicated.   Alpha Gula, MD

## 2013-08-03 ENCOUNTER — Other Ambulatory Visit: Payer: Medicaid Other

## 2013-08-03 ENCOUNTER — Ambulatory Visit (INDEPENDENT_AMBULATORY_CARE_PROVIDER_SITE_OTHER): Payer: Medicaid Other | Admitting: Obstetrics & Gynecology

## 2013-08-03 VITALS — BP 111/73 | Temp 98.2°F | Wt 174.6 lb

## 2013-08-03 DIAGNOSIS — Z3480 Encounter for supervision of other normal pregnancy, unspecified trimester: Secondary | ICD-10-CM

## 2013-08-03 DIAGNOSIS — Z3482 Encounter for supervision of other normal pregnancy, second trimester: Secondary | ICD-10-CM

## 2013-08-03 LAB — POCT URINALYSIS DIPSTICK
Bilirubin, UA: NEGATIVE
Blood, UA: NEGATIVE
Glucose, UA: NEGATIVE
Spec Grav, UA: 1.015

## 2013-08-03 NOTE — Progress Notes (Signed)
Pulse: 69

## 2013-08-04 ENCOUNTER — Encounter: Payer: Self-pay | Admitting: Obstetrics & Gynecology

## 2013-08-04 ENCOUNTER — Encounter: Payer: Medicaid Other | Admitting: Obstetrics & Gynecology

## 2013-08-04 LAB — CBC
Hemoglobin: 10.9 g/dL — ABNORMAL LOW (ref 12.0–15.0)
RBC: 3.77 MIL/uL — ABNORMAL LOW (ref 3.87–5.11)
WBC: 6.4 10*3/uL (ref 4.0–10.5)

## 2013-08-04 LAB — HIV ANTIBODY (ROUTINE TESTING W REFLEX): HIV: NONREACTIVE

## 2013-08-04 LAB — GLUCOSE TOLERANCE, 2 HOURS W/ 1HR
Glucose, 1 hour: 100 mg/dL (ref 70–170)
Glucose, 2 hour: 107 mg/dL (ref 70–139)

## 2013-08-04 LAB — RPR

## 2013-08-05 ENCOUNTER — Ambulatory Visit (INDEPENDENT_AMBULATORY_CARE_PROVIDER_SITE_OTHER): Payer: Medicaid Other | Admitting: Advanced Practice Midwife

## 2013-08-05 ENCOUNTER — Encounter: Payer: Self-pay | Admitting: Advanced Practice Midwife

## 2013-08-05 VITALS — BP 114/74 | Temp 98.5°F | Wt 175.8 lb

## 2013-08-05 DIAGNOSIS — Z3480 Encounter for supervision of other normal pregnancy, unspecified trimester: Secondary | ICD-10-CM

## 2013-08-05 DIAGNOSIS — Z3490 Encounter for supervision of normal pregnancy, unspecified, unspecified trimester: Secondary | ICD-10-CM | POA: Insufficient documentation

## 2013-08-05 DIAGNOSIS — R799 Abnormal finding of blood chemistry, unspecified: Secondary | ICD-10-CM

## 2013-08-05 DIAGNOSIS — R772 Abnormality of alphafetoprotein: Secondary | ICD-10-CM

## 2013-08-05 DIAGNOSIS — Z3482 Encounter for supervision of other normal pregnancy, second trimester: Secondary | ICD-10-CM

## 2013-08-05 LAB — POCT URINALYSIS DIPSTICK
Bilirubin, UA: NEGATIVE
Ketones, UA: NEGATIVE
Leukocytes, UA: NEGATIVE
Protein, UA: NEGATIVE
Spec Grav, UA: 1.01
pH, UA: 8

## 2013-08-05 NOTE — Progress Notes (Signed)
Pulse: 79

## 2013-08-05 NOTE — Progress Notes (Signed)
Routine Obstetrical Visit  Subjective:    Tina Mays is being seen today for her routine obstetrical visit. She is at [redacted]w[redacted]d gestation.   Patient reports no complaints.  Breast fed daughter for 1 year. Plans to [employer]. Expecting a boy. Her and her partner are excited.  Objective:     BP 114/74  Temp(Src) 98.5 F (36.9 C)  Wt 175 lb 12.8 oz (79.742 kg)  BMI 30.16 kg/m2  LMP 02/27/2013 FHR 150 FH 22  Review of [place] completed today Review of 2 hour GCT WNL   Assessment:    Pregnancy: [id number] Patient Active Problem List   Diagnosis Date Noted  . Elevated AFP 08/05/2013  . Supervision of other normal pregnancy 08/05/2013        Plan:     Prenatal vitamins. Problem list reviewed and updated. [place] and GCT results reviewed. Follow up in 4 weeks. Review options w/ breast pump to patient through insurance.  90% of 15 min visit spent on counseling and coordination of care.     Tina Mays 08/05/2013

## 2013-08-10 ENCOUNTER — Ambulatory Visit (HOSPITAL_COMMUNITY): Payer: Medicaid Other

## 2013-08-31 ENCOUNTER — Ambulatory Visit (INDEPENDENT_AMBULATORY_CARE_PROVIDER_SITE_OTHER): Payer: Medicaid Other | Admitting: Obstetrics & Gynecology

## 2013-08-31 VITALS — BP 123/72 | Temp 98.8°F | Wt 182.0 lb

## 2013-08-31 DIAGNOSIS — Z3480 Encounter for supervision of other normal pregnancy, unspecified trimester: Secondary | ICD-10-CM

## 2013-08-31 DIAGNOSIS — Z3482 Encounter for supervision of other normal pregnancy, second trimester: Secondary | ICD-10-CM

## 2013-08-31 LAB — POCT URINALYSIS DIPSTICK
Bilirubin, UA: NEGATIVE
Blood, UA: NEGATIVE
Glucose, UA: NEGATIVE
Ketones, UA: NEGATIVE
Leukocytes, UA: NEGATIVE
Nitrite, UA: NEGATIVE
pH, UA: 6

## 2013-08-31 NOTE — Progress Notes (Signed)
P- 91 Doing well.

## 2013-09-01 ENCOUNTER — Encounter: Payer: Self-pay | Admitting: Obstetrics & Gynecology

## 2013-09-01 NOTE — Patient Instructions (Signed)
Pregnancy - Second Trimester The second trimester is the period between 13 to 27 weeks of your pregnancy. It is important to follow your doctor's instructions. HOME CARE   Do not smoke.  Do not drink alcohol or use drugs.  Only take medicine as told by your doctor.  Take prenatal vitamins as told. The vitamin should contain 1 milligram of folic acid.  Exercise.  Eat healthy foods. Eat regular, well-balanced meals.  You can have sex (intercourse) if there are no other problems with the pregnancy.  Do not use hot tubs, steam rooms, or saunas.  Wear a seat belt while driving.  Avoid raw meat, uncooked cheese, and litter boxes and soil used by cats.  Visit your dentist. Cleanings are okay. GET HELP RIGHT AWAY IF:   You have a temperature by mouth above 102 F (38.9 C), not controlled by medicine.  Fluid is coming from your vagina.  Blood is coming from your vagina. Light spotting is common, especially after sex (intercourse).  You have a bad smelling fluid (discharge) coming from the vagina. The fluid changes from clear to white.  You still feel sick to your stomach (nauseous).  You throw up (vomit) blood.  You lose or gain more than 2 pounds (0.9 kilograms) of weight in a week, or as suggested by your doctor.  Your face, hands, feet, or legs get puffy (swell).  You get exposed to German measles and have never had them.  You get exposed to fifth disease or chickenpox.  You have belly (abdominal) pain.  You have a bad headache that will not go away.  You have watery poop (diarrhea), pain when you pee (urinate), or have shortness of breath.  You start to have problems seeing (blurry or double vision).  You fall, are in a car accident, or have any kind of trauma.  There is mental or physical violence at home.  You have any concerns or worries during your pregnancy. MAKE SURE YOU:   Understand these instructions.  Will watch your condition.  Will get help  right away if you are not doing well or get worse. Document Released: 02/18/2010 Document Revised: 02/16/2012 Document Reviewed: 02/18/2010 ExitCare Patient Information 2014 [employer].  

## 2013-09-14 ENCOUNTER — Ambulatory Visit (INDEPENDENT_AMBULATORY_CARE_PROVIDER_SITE_OTHER): Payer: Medicaid Other | Admitting: Obstetrics & Gynecology

## 2013-09-14 VITALS — BP 125/82 | Temp 98.7°F | Wt 185.0 lb

## 2013-09-14 DIAGNOSIS — Z3480 Encounter for supervision of other normal pregnancy, unspecified trimester: Secondary | ICD-10-CM

## 2013-09-14 DIAGNOSIS — Z3482 Encounter for supervision of other normal pregnancy, second trimester: Secondary | ICD-10-CM

## 2013-09-14 LAB — POCT URINALYSIS DIPSTICK
Bilirubin, UA: NEGATIVE
Blood, UA: NEGATIVE
Glucose, UA: NEGATIVE
Nitrite, UA: NEGATIVE
Spec Grav, UA: 1.015
Urobilinogen, UA: NEGATIVE

## 2013-09-14 NOTE — Progress Notes (Signed)
P 109 Patient reports she is doing well.

## 2013-09-28 ENCOUNTER — Encounter: Payer: Self-pay | Admitting: Obstetrics

## 2013-09-28 ENCOUNTER — Encounter: Payer: Self-pay | Admitting: Obstetrics & Gynecology

## 2013-09-28 ENCOUNTER — Ambulatory Visit (INDEPENDENT_AMBULATORY_CARE_PROVIDER_SITE_OTHER): Payer: Medicaid Other | Admitting: Obstetrics & Gynecology

## 2013-09-28 VITALS — BP 113/77 | Temp 98.7°F | Wt 188.0 lb

## 2013-09-28 DIAGNOSIS — Z3483 Encounter for supervision of other normal pregnancy, third trimester: Secondary | ICD-10-CM

## 2013-09-28 DIAGNOSIS — Z23 Encounter for immunization: Secondary | ICD-10-CM

## 2013-09-28 DIAGNOSIS — Z3480 Encounter for supervision of other normal pregnancy, unspecified trimester: Secondary | ICD-10-CM

## 2013-09-28 LAB — POCT URINALYSIS DIPSTICK
Glucose, UA: NEGATIVE
Leukocytes, UA: NEGATIVE
Protein, UA: NEGATIVE
Urobilinogen, UA: 1

## 2013-09-28 NOTE — Patient Instructions (Signed)
Tetanus, Diphtheria, Pertussis (Tdap) Vaccine What You Need to Know WHY GET VACCINATED? Tetanus, diphtheria and pertussis can be very serious diseases, even for adolescents and adults. Tdap vaccine can protect us from these diseases. TETANUS (Lockjaw) causes painful muscle tightening and stiffness, usually all over the body.  It can lead to tightening of muscles in the head and neck so you can't open your mouth, swallow, or sometimes even breathe. Tetanus kills about 1 out of 5 people who are infected. DIPHTHERIA can cause a thick coating to form in the back of the throat.  It can lead to breathing problems, paralysis, heart failure, and death. PERTUSSIS (Whooping Cough) causes severe coughing spells, which can cause difficulty breathing, vomiting and disturbed sleep.  It can also lead to weight loss, incontinence, and rib fractures. Up to 2 in 100 adolescents and 5 in 100 adults with pertussis are hospitalized or have complications, which could include pneumonia and death. These diseases are caused by bacteria. Diphtheria and pertussis are spread from person to person through coughing or sneezing. Tetanus enters the body through cuts, scratches, or wounds. Before vaccines, the [place] saw as many as 200,000 cases a year of diphtheria and pertussis, and hundreds of cases of tetanus. Since vaccination began, tetanus and diphtheria have dropped by about 99% and pertussis by about 80%. TDAP VACCINE Tdap vaccine can protect adolescents and adults from tetanus, diphtheria, and pertussis. One dose of Tdap is routinely given at age 11 or 12. People who did not get Tdap at that age should get it as soon as possible. Tdap is especially important for health care professionals and anyone having close contact with a baby younger than 12 months. Pregnant women should get a dose of Tdap during every pregnancy, to protect the newborn from pertussis. Infants are most at risk for severe, life-threatening  complications from pertussis. A similar vaccine, called Td, protects from tetanus and diphtheria, but not pertussis. A Td booster should be given every 10 years. Tdap may be given as one of these boosters if you have not already gotten a dose. Tdap may also be given after a severe cut or burn to prevent tetanus infection. Your doctor can give you more information. Tdap may safely be given at the same time as other vaccines. SOME PEOPLE SHOULD NOT GET THIS VACCINE  If you ever had a life-threatening allergic reaction after a dose of any tetanus, diphtheria, or pertussis containing vaccine, OR if you have a severe allergy to any part of this vaccine, you should not get Tdap. Tell your doctor if you have any severe allergies.  If you had a coma, or long or multiple seizures within 7 days after a childhood dose of DTP or DTaP, you should not get Tdap, unless a cause other than the vaccine was found. You can still get Td.  Talk to your doctor if you:  have epilepsy or another nervous system problem,  had severe pain or swelling after any vaccine containing diphtheria, tetanus or pertussis,  ever had Guillain-Barr Syndrome (GBS),  aren't feeling well on the day the shot is scheduled. RISKS OF A VACCINE REACTION With any medicine, including vaccines, there is a chance of side effects. These are usually mild and go away on their own, but serious reactions are also possible. Brief fainting spells can follow a vaccination, leading to injuries from falling. Sitting or lying down for about 15 minutes can help prevent these. Tell your doctor if you feel dizzy or light-headed, or   have vision changes or ringing in the ears. Mild problems following Tdap (Did not interfere with activities)  Pain where the shot was given (about 3 in 4 adolescents or 2 in 3 adults)  Redness or swelling where the shot was given (about 1 person in 5)  Mild fever of at least 100.4F (up to about 1 in 25 adolescents or 1 in  100 adults)  Headache (about 3 or 4 people in 10)  Tiredness (about 1 person in 3 or 4)  Nausea, vomiting, diarrhea, stomach ache (up to 1 in 4 adolescents or 1 in 10 adults)  Chills, body aches, sore joints, rash, swollen glands (uncommon) Moderate problems following Tdap (Interfered with activities, but did not require medical attention)  Pain where the shot was given (about 1 in 5 adolescents or 1 in 100 adults)  Redness or swelling where the shot was given (up to about 1 in 16 adolescents or 1 in 25 adults)  Fever over 102F (about 1 in 100 adolescents or 1 in 250 adults)  Headache (about 3 in 20 adolescents or 1 in 10 adults)  Nausea, vomiting, diarrhea, stomach ache (up to 1 or 3 people in 100)  Swelling of the entire arm where the shot was given (up to about 3 in 100). Severe problems following Tdap (Unable to perform usual activities, required medical attention)  Swelling, severe pain, bleeding and redness in the arm where the shot was given (rare). A severe allergic reaction could occur after any vaccine (estimated less than 1 in a million doses). WHAT IF THERE IS A SERIOUS REACTION? What should I look for?  Look for anything that concerns you, such as signs of a severe allergic reaction, very high fever, or behavior changes. Signs of a severe allergic reaction can include hives, swelling of the face and throat, difficulty breathing, a fast heartbeat, dizziness, and weakness. These would start a few minutes to a few hours after the vaccination. What should I do?  If you think it is a severe allergic reaction or other emergency that can't wait, call 9-1-1 or get the person to the nearest hospital. Otherwise, call your doctor.  Afterward, the reaction should be reported to the "Vaccine Adverse Event Reporting System" (VAERS). Your doctor might file this report, or you can do it yourself through the VAERS web site at www.vaers.hhs.gov, or by calling 1-800-822-7967. VAERS is  only for reporting reactions. They do not give medical advice.  THE NATIONAL VACCINE INJURY COMPENSATION PROGRAM The National Vaccine Injury Compensation Program (VICP) is a federal program that was created to compensate people who may have been injured by certain vaccines. Persons who believe they may have been injured by a vaccine can learn about the program and about filing a claim by calling 1-800-338-2382 or visiting the VICP website at www.hrsa.gov/vaccinecompensation. HOW CAN I LEARN MORE?  Ask your doctor.  Call your local or state health department.  Contact the Centers for Disease Control and Prevention (CDC):  Call 1-800-232-4636 or visit CDC's website at www.cdc.gov/vaccines. CDC Tdap Vaccine VIS (04/15/12) Document Released: 05/25/2012 Document Revised: 08/18/2012 Document Reviewed: 05/25/2012 ExitCare Patient Information 2014 [employer]. H1N1 Influenza (swine flu) Vaccine injection What is this medicine? H1N1 INFLUENZA (SWINE FLU) VACCINE (H1N1 in floo EN zuh (swahyn floo) vak SEEN) is a vaccine to protect from an infection with the pandemic H1N1 flu, also known as the swine flu. The vaccine only helps protect you against this one strain of the flu. This vaccine does not help   to the reduce the risk of getting other types of flu. You may also need to get the seasonal influenza virus vaccine. This medicine may be used for other purposes; ask your health care provider or pharmacist if you have questions. What should I tell my health care provider before I take this medicine? They need to know if you have any of these conditions: -Guillain-Barre syndrome -immune system problems -an unusual or allergic reaction to influenza vaccine, eggs, neomycin, polymyxin, other medicines, foods, dyes or preservatives -pregnant or trying to get pregnant -breast-feeding How should I use this medicine? This vaccine is for injection into a muscle. It is given by a health care professional. A  copy of Vaccine Information Statements will be given before each vaccination. Read this sheet carefully each time. The sheet may change frequently. Talk to your pediatrician regarding the use of this medicine in children. Special care may be needed. While this drug may be prescribed for children as young as 6 months for selected conditions, precautions do apply. Overdosage: If you think you've taken too much of this medicine contact a poison control center or emergency room at once. Overdosage: If you think you have taken too much of this medicine contact a poison control center or emergency room at once. NOTE: This medicine is only for you. Do not share this medicine with others. What if I miss a dose? If needed, keep appointments for follow-up (booster) doses as directed. It is important not to miss your dose. Call your doctor or health care professional if you are unable to keep an appointment. What may interact with this medicine? -anakinra -medicines for organ transplant -medicines to treat cancer -other vaccines -rilonacept -steroid medicines like prednisone or cortisone -tumor necrosis factor (TNF) modifiers like adalimumab, etanercept, infliximab, golimumab, or certolizumab This list may not describe all possible interactions. Give your health care provider a list of all the medicines, herbs, non-prescription drugs, or dietary supplements you use. Also tell them if you smoke, drink alcohol, or use illegal drugs. Some items may interact with your medicine. What should I watch for while using this medicine? Report any side effects to your doctor right away. This vaccine lowers your risk of getting the pandemic H1N1 flu. You can get a milder H1N1 flu infection if you are around others with this flu. This flu vaccine will not protect against colds or other illnesses including other flu viruses. You may also need the seasonal influenza vaccine. What side effects may I notice from receiving this  medicine? Side effects that you should report to your doctor or health care professional as soon as possible: -allergic reactions like skin rash, itching or hives, swelling of the face, lips, or tongue -breathing problems -muscle weakness -unusual drooping or paralysis of face Side effects that usually do not require medical attention (Report these to your doctor or health care professional if they continue or are bothersome.): -chills -cough -headache -muscle aches and pains -runny or stuffy nose -sore throat -stomach upset -tiredness This list may not describe all possible side effects. Call your doctor for medical advice about side effects. You may report side effects to FDA at 1-800-FDA-1088. Where should I keep my medicine? This vaccine is only given in a clinic, pharmacy, doctor's office, or other health care setting and will not be stored at home. NOTE: This sheet is a summary. It may not cover all possible information. If you have questions about this medicine, talk to your doctor, pharmacist, or health care provider.    2012, Elsevier/Gold Standard. (10/24/2008 4:49:51 PM) 

## 2013-09-28 NOTE — Addendum Note (Signed)
Addended by: Glendell Docker on: 09/28/2013 11:53 AM   Modules accepted: Orders

## 2013-09-28 NOTE — Progress Notes (Signed)
p84 Low back occasionally especially when lying down, states she has had a few contractions every few days.

## 2013-10-12 ENCOUNTER — Inpatient Hospital Stay (HOSPITAL_COMMUNITY)
Admission: AD | Admit: 2013-10-12 | Discharge: 2013-10-12 | Disposition: A | Discharge status: Home / Self Care | Payer: Medicaid Other | Source: Ambulatory Visit | Attending: Obstetrics & Gynecology | Admitting: Obstetrics & Gynecology

## 2013-10-12 ENCOUNTER — Encounter: Payer: Self-pay | Admitting: Obstetrics & Gynecology

## 2013-10-12 ENCOUNTER — Other Ambulatory Visit: Payer: Self-pay | Admitting: Obstetrics & Gynecology

## 2013-10-12 ENCOUNTER — Other Ambulatory Visit: Payer: Self-pay | Admitting: *Deleted

## 2013-10-12 ENCOUNTER — Ambulatory Visit (INDEPENDENT_AMBULATORY_CARE_PROVIDER_SITE_OTHER): Payer: Medicaid Other | Admitting: Obstetrics & Gynecology

## 2013-10-12 VITALS — BP 118/70 | Temp 97.9°F | Wt 185.0 lb

## 2013-10-12 DIAGNOSIS — O365931 Maternal care for other known or suspected poor fetal growth, third trimester, fetus 1: Secondary | ICD-10-CM

## 2013-10-12 DIAGNOSIS — Z3480 Encounter for supervision of other normal pregnancy, unspecified trimester: Secondary | ICD-10-CM

## 2013-10-12 DIAGNOSIS — O479 False labor, unspecified: Secondary | ICD-10-CM

## 2013-10-12 DIAGNOSIS — O4700 False labor before 37 completed weeks of gestation, unspecified trimester: Secondary | ICD-10-CM | POA: Insufficient documentation

## 2013-10-12 LAB — POCT URINALYSIS DIPSTICK
Bilirubin, UA: NEGATIVE
Blood, UA: NEGATIVE
Nitrite, UA: NEGATIVE
Protein, UA: NEGATIVE
Spec Grav, UA: 1.01
Urobilinogen, UA: NEGATIVE

## 2013-10-12 MED ORDER — BETAMETHASONE SOD PHOS & ACET 6 (3-3) MG/ML IJ SUSP
12.0000 mg | Freq: Once | INTRAMUSCULAR | Status: AC
  Administered 2013-10-12: 12 mg via INTRAMUSCULAR
  Filled 2013-10-12: qty 2

## 2013-10-12 MED ORDER — BETAMETHASONE SOD PHOS & ACET 6 (3-3) MG/ML IJ SUSP: 12.5000 mg | INTRAMUSCULAR | Status: AC

## 2013-10-12 NOTE — Patient Instructions (Signed)
Preterm Labor Information Preterm labor is when labor starts at less than 37 weeks of pregnancy. The normal length of a pregnancy is 39 to 41 weeks. CAUSES Often, there is no identifiable underlying cause as to why a woman goes into preterm labor. One of the most common known causes of preterm labor is infection. Infections of the uterus, cervix, vagina, amniotic sac, bladder, kidney, or even the lungs (pneumonia) can cause labor to start. Other suspected causes of preterm labor include:   Urogenital infections, such as yeast infections and bacterial vaginosis.   Uterine abnormalities (uterine shape, uterine septum, fibroids, or bleeding from the placenta).   A cervix that has been operated on (it may fail to stay closed).   Malformations in the fetus.   Multiple gestations (twins, triplets, and so on).   Breakage of the amniotic sac.  RISK FACTORS  Having a previous history of preterm labor.   Having premature rupture of membranes (PROM).   Having a placenta that covers the opening of the cervix (placenta previa).   Having a placenta that separates from the uterus (placental abruption).   Having a cervix that is too weak to hold the fetus in the uterus (incompetent cervix).   Having too much fluid in the amniotic sac (polyhydramnios).   Taking illegal drugs or smoking while pregnant.   Not gaining enough weight while pregnant.   Being younger than 18 and older than 23 years old.   Having a low socioeconomic status.   Being African American. SYMPTOMS Signs and symptoms of preterm labor include:   Menstrual-like cramps, abdominal pain, or back pain.  Uterine contractions that are regular, as frequent as six in an hour, regardless of their intensity (may be mild or painful).  Contractions that start on the top of the uterus and spread down to the lower abdomen and back.   A sense of increased pelvic pressure.   A watery or bloody mucus discharge that  comes from the vagina.  TREATMENT Depending on the length of the pregnancy and other circumstances, your health care provider may suggest bed rest. If necessary, there are medicines that can be given to stop contractions and to mature the fetal lungs. If labor happens before 34 weeks of pregnancy, a prolonged hospital stay may be recommended. Treatment depends on the condition of both you and the fetus.  WHAT SHOULD YOU DO IF YOU THINK YOU ARE IN PRETERM LABOR? Call your health care provider right away. You will need to go to the hospital to get checked immediately. HOW CAN YOU PREVENT PRETERM LABOR IN FUTURE PREGNANCIES? You should:   Stop smoking if you smoke.  Maintain healthy weight gain and avoid chemicals and drugs that are not necessary.  Be watchful for any type of infection.  Inform your health care provider if you have a known history of preterm labor. Document Released: 02/14/2004 Document Revised: 07/27/2013 Document Reviewed: 12/27/2012 ExitCare Patient Information 2014 [employer].    

## 2013-10-12 NOTE — Progress Notes (Signed)
Pulse- 82 Pt states she is having pain in her lower abdomen and back mostly at night. Pt is having a brownish mucous discharge. Pt states she is having contractions that come and go. SPEC: no heme.  Toco w/minimal uterine activity.  To [facility] for betamethasone.

## 2013-10-13 ENCOUNTER — Ambulatory Visit (HOSPITAL_COMMUNITY)
Admission: RE | Admit: 2013-10-13 | Discharge: 2013-10-13 | Disposition: A | Discharge status: Home / Self Care | Payer: Medicaid Other | Source: Ambulatory Visit | Attending: Obstetrics & Gynecology | Admitting: Obstetrics & Gynecology

## 2013-10-13 ENCOUNTER — Other Ambulatory Visit: Payer: Self-pay | Admitting: Obstetrics & Gynecology

## 2013-10-13 ENCOUNTER — Inpatient Hospital Stay (HOSPITAL_COMMUNITY)
Admission: AD | Admit: 2013-10-13 | Discharge: 2013-10-13 | Disposition: A | Discharge status: Home / Self Care | Payer: Medicaid Other | Source: Ambulatory Visit | Attending: Obstetrics | Admitting: Obstetrics

## 2013-10-13 DIAGNOSIS — Z3689 Encounter for other specified antenatal screening: Secondary | ICD-10-CM | POA: Insufficient documentation

## 2013-10-13 DIAGNOSIS — O365931 Maternal care for other known or suspected poor fetal growth, third trimester, fetus 1: Secondary | ICD-10-CM

## 2013-10-13 DIAGNOSIS — O4700 False labor before 37 completed weeks of gestation, unspecified trimester: Secondary | ICD-10-CM | POA: Insufficient documentation

## 2013-10-13 DIAGNOSIS — O4703 False labor before 37 completed weeks of gestation, third trimester: Secondary | ICD-10-CM

## 2013-10-13 DIAGNOSIS — Z3483 Encounter for supervision of other normal pregnancy, third trimester: Secondary | ICD-10-CM

## 2013-10-13 DIAGNOSIS — R772 Abnormality of alphafetoprotein: Secondary | ICD-10-CM

## 2013-10-13 DIAGNOSIS — O365990 Maternal care for other known or suspected poor fetal growth, unspecified trimester, not applicable or unspecified: Secondary | ICD-10-CM | POA: Insufficient documentation

## 2013-10-13 MED ORDER — BETAMETHASONE SOD PHOS & ACET 6 (3-3) MG/ML IJ SUSP
12.0000 mg | Freq: Once | INTRAMUSCULAR | Status: AC
  Administered 2013-10-13: 12 mg via INTRAMUSCULAR
  Filled 2013-10-13: qty 2

## 2013-10-13 NOTE — MAU Note (Signed)
Pt here for 2nd betamethasone injection.

## 2013-10-17 ENCOUNTER — Ambulatory Visit (INDEPENDENT_AMBULATORY_CARE_PROVIDER_SITE_OTHER): Payer: Medicaid Other | Admitting: Obstetrics & Gynecology

## 2013-10-17 ENCOUNTER — Encounter: Payer: Self-pay | Admitting: Obstetrics & Gynecology

## 2013-10-17 VITALS — BP 120/73 | Temp 98.3°F | Wt 188.0 lb

## 2013-10-17 DIAGNOSIS — Z3483 Encounter for supervision of other normal pregnancy, third trimester: Secondary | ICD-10-CM

## 2013-10-17 DIAGNOSIS — Z3480 Encounter for supervision of other normal pregnancy, unspecified trimester: Secondary | ICD-10-CM

## 2013-10-17 LAB — POCT URINALYSIS DIPSTICK
Bilirubin, UA: NEGATIVE
Blood, UA: NEGATIVE
Glucose, UA: NEGATIVE
Ketones, UA: NEGATIVE
Leukocytes, UA: NEGATIVE
Nitrite, UA: NEGATIVE
Urobilinogen, UA: NEGATIVE

## 2013-10-17 NOTE — Progress Notes (Signed)
P 87 Patient reports she has light contractions that are not painful. Patient has lower abdominal pain with walking. Patient states she has no concerns for the doctor today.

## 2013-10-31 ENCOUNTER — Encounter: Payer: Medicaid Other | Admitting: Obstetrics & Gynecology

## 2013-11-02 ENCOUNTER — Encounter: Payer: Self-pay | Admitting: Obstetrics

## 2013-11-02 ENCOUNTER — Ambulatory Visit (INDEPENDENT_AMBULATORY_CARE_PROVIDER_SITE_OTHER): Payer: Medicaid Other | Admitting: Obstetrics

## 2013-11-02 VITALS — BP 114/79 | Temp 98.4°F | Wt 198.0 lb

## 2013-11-02 DIAGNOSIS — Z3483 Encounter for supervision of other normal pregnancy, third trimester: Secondary | ICD-10-CM

## 2013-11-02 DIAGNOSIS — Z3480 Encounter for supervision of other normal pregnancy, unspecified trimester: Secondary | ICD-10-CM

## 2013-11-02 LAB — POCT URINALYSIS DIPSTICK
Glucose, UA: NEGATIVE
Nitrite, UA: NEGATIVE
Protein, UA: NEGATIVE
Urobilinogen, UA: NEGATIVE

## 2013-11-02 NOTE — Progress Notes (Signed)
HR - 90 Pt in office for routine OB visit, states she is having abd and back pain, states she gets light headed when she has contractions, reports blood tinged mucous discharge 2 days ago.

## 2013-11-09 ENCOUNTER — Ambulatory Visit (INDEPENDENT_AMBULATORY_CARE_PROVIDER_SITE_OTHER): Payer: Medicaid Other | Admitting: Obstetrics & Gynecology

## 2013-11-09 VITALS — BP 129/84 | Temp 98.0°F | Wt 192.0 lb

## 2013-11-09 DIAGNOSIS — Z3483 Encounter for supervision of other normal pregnancy, third trimester: Secondary | ICD-10-CM

## 2013-11-09 DIAGNOSIS — O0993 Supervision of high risk pregnancy, unspecified, third trimester: Secondary | ICD-10-CM

## 2013-11-09 DIAGNOSIS — Z3480 Encounter for supervision of other normal pregnancy, unspecified trimester: Secondary | ICD-10-CM

## 2013-11-09 DIAGNOSIS — O0990 Supervision of high risk pregnancy, unspecified, unspecified trimester: Secondary | ICD-10-CM

## 2013-11-09 LAB — POCT URINALYSIS DIPSTICK
Ketones, UA: NEGATIVE
Leukocytes, UA: NEGATIVE
Protein, UA: NEGATIVE
Spec Grav, UA: 1.01
Urobilinogen, UA: NEGATIVE
pH, UA: 6

## 2013-11-09 LAB — OB RESULTS CONSOLE GC/CHLAMYDIA
Chlamydia: NEGATIVE
Gonorrhea: NEGATIVE

## 2013-11-09 NOTE — Progress Notes (Deleted)
HR - 86 Subjective:    Tina Mays is a 22 y.o. female being seen today for her obstetrical visit. She is at [redacted]w[redacted]d gestation. Patient reports bleeding and pelvic pain . Fetal movement: normal.  Menstrual History: OB History   Grav Para Term Preterm Abortions TAB SAB Ect Mult Living   4 1 1  2  1   1       Menarche age: 51 Patient's last menstrual period was 02/27/2013.    {Common ambulatory SmartLinks:19316}  Review of Systems {ros; complete:30496}   Objective:    BP 129/84  Temp(Src) 98 F (36.7 C)  Wt 192 lb (87.091 kg)  LMP 02/27/2013 FHT:  *** BPM  Uterine Size: {fundal height:14540}  Presentation: {fetal pos:14558}     Assessment:    Pregnancy {numbers; 0-42:17906} and {numbers; 0-7:15237}/7 weeks   Plan:    28-week labs reviewed, {norm/abn:16337} {ob counseling:14517} Follow up in {follow up OB:14565}.

## 2013-11-10 LAB — GC/CHLAMYDIA PROBE AMP: CT Probe RNA: NEGATIVE

## 2013-11-11 ENCOUNTER — Encounter: Payer: Self-pay | Admitting: Obstetrics & Gynecology

## 2013-11-11 NOTE — Progress Notes (Signed)
Doing well 

## 2013-11-13 ENCOUNTER — Inpatient Hospital Stay (HOSPITAL_COMMUNITY)
Admission: AD | Admit: 2013-11-13 | Discharge: 2013-11-16 | DRG: 775 | Disposition: A | Discharge status: Home / Self Care | Payer: Medicaid Other | Source: Ambulatory Visit | Attending: Obstetrics & Gynecology | Admitting: Obstetrics & Gynecology

## 2013-11-13 ENCOUNTER — Encounter (HOSPITAL_COMMUNITY): Payer: Self-pay | Admitting: *Deleted

## 2013-11-13 DIAGNOSIS — IMO0001 Reserved for inherently not codable concepts without codable children: Secondary | ICD-10-CM

## 2013-11-13 DIAGNOSIS — O99892 Other specified diseases and conditions complicating childbirth: Secondary | ICD-10-CM | POA: Diagnosis present

## 2013-11-13 DIAGNOSIS — R772 Abnormality of alphafetoprotein: Secondary | ICD-10-CM

## 2013-11-13 DIAGNOSIS — Z22330 Carrier of Group B streptococcus: Secondary | ICD-10-CM

## 2013-11-13 DIAGNOSIS — Z3483 Encounter for supervision of other normal pregnancy, third trimester: Secondary | ICD-10-CM

## 2013-11-13 DIAGNOSIS — O4703 False labor before 37 completed weeks of gestation, third trimester: Secondary | ICD-10-CM

## 2013-11-13 LAB — CBC
HCT: 31 % — ABNORMAL LOW (ref 36.0–46.0)
MCH: 29.4 pg (ref 26.0–34.0)
MCV: 84.5 fL (ref 78.0–100.0)
Platelets: 243 10*3/uL (ref 150–400)
RBC: 3.67 MIL/uL — ABNORMAL LOW (ref 3.87–5.11)
WBC: 7.8 10*3/uL (ref 4.0–10.5)

## 2013-11-13 MED ORDER — BUTORPHANOL TARTRATE 1 MG/ML IJ SOLN
1.0000 mg | INTRAMUSCULAR | Status: DC | PRN
  Administered 2013-11-13: 1 mg via INTRAVENOUS
  Filled 2013-11-13: qty 1

## 2013-11-13 MED ORDER — ONDANSETRON HCL 4 MG/2ML IJ SOLN
4.0000 mg | Freq: Four times a day (QID) | INTRAMUSCULAR | Status: DC | PRN
  Administered 2013-11-13: 4 mg via INTRAVENOUS
  Filled 2013-11-13: qty 2

## 2013-11-13 MED ORDER — CITRIC ACID-SODIUM CITRATE 334-500 MG/5ML PO SOLN
30.0000 mL | ORAL | Status: DC | PRN
  Administered 2013-11-14: 30 mL via ORAL
  Filled 2013-11-13: qty 15

## 2013-11-13 MED ORDER — PENICILLIN G POTASSIUM 5000000 UNITS IJ SOLR
5.0000 10*6.[IU] | Freq: Once | INTRAVENOUS | Status: AC
  Administered 2013-11-13: 5 10*6.[IU] via INTRAVENOUS
  Filled 2013-11-13: qty 5

## 2013-11-13 MED ORDER — LACTATED RINGERS IV SOLN: 500.0000 mL | Freq: Once | INTRAVENOUS | Status: DC

## 2013-11-13 MED ORDER — PHENYLEPHRINE 40 MCG/ML (10ML) SYRINGE FOR IV PUSH (FOR BLOOD PRESSURE SUPPORT)
80.0000 ug | PREFILLED_SYRINGE | INTRAVENOUS | Status: DC | PRN
  Filled 2013-11-13: qty 2
  Filled 2013-11-13: qty 10

## 2013-11-13 MED ORDER — OXYTOCIN BOLUS FROM INFUSION
500.0000 mL | INTRAVENOUS | Status: DC
  Administered 2013-11-14: 500 mL via INTRAVENOUS

## 2013-11-13 MED ORDER — PHENYLEPHRINE 40 MCG/ML (10ML) SYRINGE FOR IV PUSH (FOR BLOOD PRESSURE SUPPORT)
80.0000 ug | PREFILLED_SYRINGE | INTRAVENOUS | Status: DC | PRN
  Filled 2013-11-13: qty 2

## 2013-11-13 MED ORDER — OXYTOCIN 40 UNITS IN LACTATED RINGERS INFUSION - SIMPLE MED
62.5000 mL/h | INTRAVENOUS | Status: DC
  Administered 2013-11-14: 62.5 mL/h via INTRAVENOUS
  Filled 2013-11-13: qty 1000

## 2013-11-13 MED ORDER — LACTATED RINGERS IV SOLN
INTRAVENOUS | Status: DC
  Administered 2013-11-13: 21:00:00 via INTRAVENOUS

## 2013-11-13 MED ORDER — FENTANYL 2.5 MCG/ML BUPIVACAINE 1/10 % EPIDURAL INFUSION (WH - ANES)
14.0000 mL/h | INTRAMUSCULAR | Status: DC | PRN
  Administered 2013-11-14 (×2): 14 mL/h via EPIDURAL
  Filled 2013-11-13 (×2): qty 125

## 2013-11-13 MED ORDER — LACTATED RINGERS IV SOLN: 500.0000 mL | INTRAVENOUS | Status: DC | PRN

## 2013-11-13 MED ORDER — OXYCODONE-ACETAMINOPHEN 5-325 MG PO TABS: 1.0000 | ORAL_TABLET | ORAL | Status: DC | PRN

## 2013-11-13 MED ORDER — DIPHENHYDRAMINE HCL 50 MG/ML IJ SOLN
12.5000 mg | INTRAMUSCULAR | Status: DC | PRN
  Administered 2013-11-14: 12.5 mg via INTRAVENOUS
  Filled 2013-11-13: qty 1

## 2013-11-13 MED ORDER — IBUPROFEN 600 MG PO TABS: 600.0000 mg | ORAL_TABLET | Freq: Four times a day (QID) | ORAL | Status: DC | PRN

## 2013-11-13 MED ORDER — ACETAMINOPHEN 325 MG PO TABS: 650.0000 mg | ORAL_TABLET | ORAL | Status: DC | PRN

## 2013-11-13 MED ORDER — EPHEDRINE 5 MG/ML INJ
10.0000 mg | INTRAVENOUS | Status: DC | PRN
  Filled 2013-11-13: qty 2

## 2013-11-13 MED ORDER — FLEET ENEMA 7-19 GM/118ML RE ENEM: 1.0000 | ENEMA | RECTAL | Status: DC | PRN

## 2013-11-13 MED ORDER — EPHEDRINE 5 MG/ML INJ
10.0000 mg | INTRAVENOUS | Status: DC | PRN
  Filled 2013-11-13: qty 4
  Filled 2013-11-13: qty 2

## 2013-11-13 MED ORDER — LIDOCAINE HCL (PF) 1 % IJ SOLN
30.0000 mL | INTRAMUSCULAR | Status: AC | PRN
  Administered 2013-11-14: 30 mL via SUBCUTANEOUS
  Filled 2013-11-13 (×2): qty 30

## 2013-11-13 MED ORDER — DEXTROSE 5 % IV SOLN
2.5000 10*6.[IU] | INTRAVENOUS | Status: DC
  Administered 2013-11-14 (×2): 2.5 10*6.[IU] via INTRAVENOUS
  Filled 2013-11-13 (×5): qty 2.5

## 2013-11-13 NOTE — MAU Note (Signed)
Pt. Here for contractions that began about 30 mins before arrival. Thinks water broke around [employer]. Still leaking fluid now. Denies any bleeding and baby is moving well.

## 2013-11-14 ENCOUNTER — Inpatient Hospital Stay (HOSPITAL_COMMUNITY): Payer: Medicaid Other | Admitting: Anesthesiology

## 2013-11-14 ENCOUNTER — Encounter (HOSPITAL_COMMUNITY): Payer: Medicaid Other | Admitting: Anesthesiology

## 2013-11-14 ENCOUNTER — Encounter (HOSPITAL_COMMUNITY): Payer: Self-pay

## 2013-11-14 LAB — RPR: RPR Ser Ql: NONREACTIVE

## 2013-11-14 MED ORDER — ONDANSETRON HCL 4 MG PO TABS: 4.0000 mg | ORAL_TABLET | ORAL | Status: DC | PRN

## 2013-11-14 MED ORDER — TETANUS-DIPHTH-ACELL PERTUSSIS 5-2.5-18.5 LF-MCG/0.5 IM SUSP: 0.5000 mL | Freq: Once | INTRAMUSCULAR | Status: DC

## 2013-11-14 MED ORDER — DIPHENHYDRAMINE HCL 25 MG PO CAPS: 25.0000 mg | ORAL_CAPSULE | Freq: Four times a day (QID) | ORAL | Status: DC | PRN

## 2013-11-14 MED ORDER — ONDANSETRON HCL 4 MG/2ML IJ SOLN: 4.0000 mg | INTRAMUSCULAR | Status: DC | PRN

## 2013-11-14 MED ORDER — LACTATED RINGERS IV SOLN
INTRAVENOUS | Status: DC
  Administered 2013-11-14: 07:00:00 via INTRAUTERINE

## 2013-11-14 MED ORDER — DIBUCAINE 1 % RE OINT: 1.0000 "application " | TOPICAL_OINTMENT | RECTAL | Status: DC | PRN

## 2013-11-14 MED ORDER — OXYTOCIN 40 UNITS IN LACTATED RINGERS INFUSION - SIMPLE MED
1.0000 m[IU]/min | INTRAVENOUS | Status: DC
  Administered 2013-11-14: 2 m[IU]/min via INTRAVENOUS

## 2013-11-14 MED ORDER — FERROUS SULFATE 325 (65 FE) MG PO TABS
325.0000 mg | ORAL_TABLET | Freq: Two times a day (BID) | ORAL | Status: DC
  Administered 2013-11-14 – 2013-11-16 (×4): 325 mg via ORAL
  Filled 2013-11-14 (×4): qty 1

## 2013-11-14 MED ORDER — BENZOCAINE-MENTHOL 20-0.5 % EX AERO
1.0000 "application " | INHALATION_SPRAY | CUTANEOUS | Status: DC | PRN
  Administered 2013-11-14 – 2013-11-16 (×2): 1 via TOPICAL
  Filled 2013-11-14: qty 56

## 2013-11-14 MED ORDER — LANOLIN HYDROUS EX OINT: TOPICAL_OINTMENT | CUTANEOUS | Status: DC | PRN

## 2013-11-14 MED ORDER — TERBUTALINE SULFATE 1 MG/ML IJ SOLN: 0.2500 mg | Freq: Once | INTRAMUSCULAR | Status: DC | PRN

## 2013-11-14 MED ORDER — ZOLPIDEM TARTRATE 5 MG PO TABS: 5.0000 mg | ORAL_TABLET | Freq: Every evening | ORAL | Status: DC | PRN

## 2013-11-14 MED ORDER — SENNOSIDES-DOCUSATE SODIUM 8.6-50 MG PO TABS
2.0000 | ORAL_TABLET | ORAL | Status: DC
  Administered 2013-11-14 – 2013-11-16 (×2): 2 via ORAL
  Filled 2013-11-14 (×2): qty 2

## 2013-11-14 MED ORDER — WITCH HAZEL-GLYCERIN EX PADS: 1.0000 "application " | MEDICATED_PAD | CUTANEOUS | Status: DC | PRN

## 2013-11-14 MED ORDER — MAGNESIUM HYDROXIDE 400 MG/5ML PO SUSP: 30.0000 mL | ORAL | Status: DC | PRN

## 2013-11-14 MED ORDER — PRENATAL MULTIVITAMIN CH
1.0000 | ORAL_TABLET | Freq: Every day | ORAL | Status: DC
  Administered 2013-11-14 – 2013-11-16 (×3): 1 via ORAL
  Filled 2013-11-14 (×3): qty 1

## 2013-11-14 MED ORDER — OXYCODONE-ACETAMINOPHEN 5-325 MG PO TABS: 1.0000 | ORAL_TABLET | ORAL | Status: DC | PRN

## 2013-11-14 MED ORDER — MEASLES, MUMPS & RUBELLA VAC ~~LOC~~ INJ: 0.5000 mL | INJECTION | Freq: Once | SUBCUTANEOUS | Status: DC

## 2013-11-14 MED ORDER — IBUPROFEN 600 MG PO TABS
600.0000 mg | ORAL_TABLET | Freq: Four times a day (QID) | ORAL | Status: DC
  Administered 2013-11-14 – 2013-11-16 (×9): 600 mg via ORAL
  Filled 2013-11-14 (×9): qty 1

## 2013-11-14 MED ORDER — LIDOCAINE HCL (PF) 1 % IJ SOLN
INTRAMUSCULAR | Status: DC | PRN
  Administered 2013-11-14 (×4): 4 mL

## 2013-11-14 NOTE — Anesthesia Preprocedure Evaluation (Signed)
Anesthesia Evaluation  Patient identified by MRN, date of birth, ID band Patient awake    Reviewed: Allergy & Precautions, H&P , NPO status , Patient's Chart, lab work & pertinent test results, reviewed documented beta blocker date and time   History of Anesthesia Complications Negative for: history of anesthetic complications  Airway Mallampati: II TM Distance: >3 FB Neck ROM: full    Dental  (+) Teeth Intact   Pulmonary neg pulmonary ROS,  breath sounds clear to auscultation        Cardiovascular negative cardio ROS  Rhythm:regular Rate:Normal     Neuro/Psych negative neurological ROS  negative psych ROS   GI/Hepatic Neg liver ROS, GERD-  ,  Endo/Other  negative endocrine ROS  Renal/GU negative Renal ROS     Musculoskeletal   Abdominal   Peds  Hematology  (+) anemia ,   Anesthesia Other Findings   Reproductive/Obstetrics (+) Pregnancy                           Anesthesia Physical Anesthesia Plan  ASA: II  Anesthesia Plan: Epidural   Post-op Pain Management:    Induction:   Airway Management Planned:   Additional Equipment:   Intra-op Plan:   Post-operative Plan:   Informed Consent: I have reviewed the patients History and Physical, chart, labs and discussed the procedure including the risks, benefits and alternatives for the proposed anesthesia with the patient or authorized representative who has indicated his/her understanding and acceptance.     Plan Discussed with:   Anesthesia Plan Comments:         Anesthesia Quick Evaluation

## 2013-11-14 NOTE — H&P (Signed)
This is Dr. Francoise Ceo dictating the history and physical on blank blank she's a 22 year old gravida 4 para 1021 ED [facility] 12/28 1437 weeks today admitted with ruptured membranes which occurred at 7:30 PM last night positive GBS and not penicillin she is now 7 cm 100% vertex at a 0 station and the fluid was clear Past medical history negative Past surgical history negative Social history negative System review negative Physical exam well-developed female in labor HEENT negative Lungs clear to P&A Heart regular rhythm no murmurs no gallops Breasts negative Abdomen term Pelvic as described above Extremities negative and

## 2013-11-14 NOTE — Anesthesia Postprocedure Evaluation (Signed)
Anesthesia Post Note  Patient: Tina Mays  Procedure(s) Performed: * No procedures listed *  Anesthesia type: Epidural  Patient location: Mother/Baby  Post pain: Pain level controlled  Post assessment: Post-op Vital signs reviewed  Last Vitals:  Filed Vitals:   11/14/13 1900  BP: 127/77  Pulse: 94  Temp: 37.3 C  Resp: 18    Post vital signs: Reviewed  Level of consciousness:alert  Complications: No apparent anesthesia complications

## 2013-11-14 NOTE — Anesthesia Procedure Notes (Signed)
Epidural Patient location during procedure: OB Start time: 11/14/2013 12:55 AM  Staffing Performed by: anesthesiologist   Preanesthetic Checklist Completed: patient identified, site marked, surgical consent, pre-op evaluation, timeout performed, IV checked, risks and benefits discussed and monitors and equipment checked  Epidural Patient position: sitting Prep: site prepped and draped and DuraPrep Patient monitoring: continuous pulse ox and blood pressure Approach: midline Injection technique: LOR air  Needle:  Needle type: Tuohy  Needle gauge: 17 G Needle length: 9 cm and 9 Needle insertion depth: 6 cm Catheter type: closed end flexible Catheter size: 19 Gauge Catheter at skin depth: 11 cm Test dose: negative  Assessment Events: blood not aspirated, injection not painful, no injection resistance, negative IV test and no paresthesia  Additional Notes Discussed risk of headache, infection, bleeding, nerve injury and failed or incomplete block.  Patient voices understanding and wishes to proceed.  Epidural placed easily on first attempt.  No paresthesia.  Patient tolerated procedure well with no apparent complications.  A. Shakeya Kerkman,MDReason for block:procedure for pain

## 2013-11-15 LAB — CBC
MCH: 29.5 pg (ref 26.0–34.0)
MCV: 85.7 fL (ref 78.0–100.0)
Platelets: 216 10*3/uL (ref 150–400)
RBC: 3.36 MIL/uL — ABNORMAL LOW (ref 3.87–5.11)
RDW: 13.7 % (ref 11.5–15.5)
WBC: 17.3 10*3/uL — ABNORMAL HIGH (ref 4.0–10.5)

## 2013-11-15 NOTE — Progress Notes (Signed)
Post Partum Day 1 Subjective: no complaints  Objective: Blood pressure 111/69, pulse 93, temperature 98.4 F (36.9 C), temperature source Oral, resp. rate 19, height 5\' 4"  (1.626 m), weight 192 lb (87.091 kg), last menstrual period 02/27/2013, SpO2 99.00%, unknown if currently breastfeeding.  Physical Exam:  General: alert and no distress Lochia: appropriate Uterine Fundus: firm Incision: healing well DVT Evaluation: No evidence of DVT seen on physical exam.   Recent Labs  11/13/13 2030 11/15/13 0550  HGB 10.8* 9.9*  HCT 31.0* 28.8*    Assessment/Plan: Plan for discharge tomorrow   LOS: 2 days   HARPER,CHARLES A 11/15/2013, 8:35 AM

## 2013-11-15 NOTE — Progress Notes (Signed)
UR completed 

## 2013-11-16 ENCOUNTER — Encounter: Payer: Medicaid Other | Admitting: Obstetrics & Gynecology

## 2013-11-16 MED ORDER — OXYCODONE-ACETAMINOPHEN 5-325 MG PO TABS: 1.0000 | ORAL_TABLET | ORAL | Status: DC | PRN

## 2013-11-16 MED ORDER — IBUPROFEN 600 MG PO TABS: 600.0000 mg | ORAL_TABLET | Freq: Four times a day (QID) | ORAL | Status: DC | PRN

## 2013-11-16 MED ORDER — FUSION PLUS PO CAPS: 1.0000 | ORAL_CAPSULE | Freq: Every day | ORAL | Status: DC

## 2013-11-16 NOTE — Progress Notes (Signed)
Post Partum Day 2 Subjective: no complaints  Objective: Blood pressure 100/59, pulse 92, temperature 97.9 F (36.6 C), temperature source Oral, resp. rate 18, height 5\' 4"  (1.626 m), weight 192 lb (87.091 kg), last menstrual period 02/27/2013, SpO2 99.00%, unknown if currently breastfeeding.  Physical Exam:  General: alert and no distress Lochia: appropriate Uterine Fundus: firm Incision: healing well DVT Evaluation: No evidence of DVT seen on physical exam.   Recent Labs  11/13/13 2030 11/15/13 0550  HGB 10.8* 9.9*  HCT 31.0* 28.8*    Assessment/Plan: Discharge home   LOS: 3 days   Tina Mays A 11/16/2013, 8:40 AM

## 2013-11-16 NOTE — Progress Notes (Signed)
Mother requested supplementation for Infant stating "Everytime I put him(infant) down, he wakes up and wants to nurse a little before he falls asleep. Mother given education on cluster feeding and difficulties that could happen if infant had formula such as engorgement, Allergies, and decreased milk supply. Mother stated "I breast and bottle fed my other child and I nursed for a year". Mother given supplement with instructions on the amount the infant should have.

## 2013-11-16 NOTE — Discharge Summary (Signed)
Obstetric Discharge Summary Reason for Admission: onset of labor Prenatal Procedures: ultrasound Intrapartum Procedures: spontaneous vaginal delivery Postpartum Procedures: none Complications-Operative and Postpartum: none Hemoglobin  Date Value Range Status  11/15/2013 9.9* 12.0 - 15.0 g/dL Final     HCT  Date Value Range Status  11/15/2013 28.8* 36.0 - 46.0 % Final    Physical Exam:  General: alert and no distress Lochia: appropriate Uterine Fundus: firm Incision: healing well DVT Evaluation: No evidence of DVT seen on physical exam.  Discharge Diagnoses: Term Pregnancy-delivered  Discharge Information: Date: 11/16/2013 Activity: pelvic rest Diet: routine Medications: PNV, Ibuprofen, Colace, Iron and Percocet Condition: stable Instructions: refer to practice specific booklet Discharge to: home Follow-up Information   Follow up with Antionette Char A, MD. Schedule an appointment as soon as possible for Mays visit in 2 weeks.   Specialty:  Obstetrics and Gynecology   Contact information:   [address] [place] [address] [phone]       Newborn Data: Live born female  Birth Weight: 6 lb 12.5 oz (3075 g) APGAR: 9, 9  Home with mother.  Tina Mays,Tina Mays 11/16/2013, 8:44 AM

## 2013-11-18 ENCOUNTER — Encounter: Payer: Medicaid Other | Admitting: Obstetrics & Gynecology

## 2013-12-07 ENCOUNTER — Ambulatory Visit (INDEPENDENT_AMBULATORY_CARE_PROVIDER_SITE_OTHER): Payer: Medicaid Other | Admitting: Obstetrics & Gynecology

## 2013-12-07 ENCOUNTER — Encounter: Payer: Self-pay | Admitting: Obstetrics & Gynecology

## 2013-12-07 NOTE — Progress Notes (Signed)
Subjective:     Tina Mays is a 22 y.o. female who presents for a postpartum visit. She is 3 weeks postpartum following a spontaneous vaginal delivery. I have fully reviewed the prenatal and intrapartum course. The delivery was at 37 gestational weeks. Outcome: spontaneous vaginal delivery. Anesthesia: epidural. Postpartum course has been WNL. Baby's course has been WNL. Baby is feeding by bottle - [place] and [employer] Gent[employer]. Bleeding no bleeding. Bowel function is normal. Bladder function is normal. Patient is not sexually active. Contraception method is abstinence. Postpartum depression screening: negative.  The following portions of the patient's history were reviewed and updated as appropriate: allergies, current medications, past family history, past medical history, past social history, past surgical history and problem list.  Review of Systems Pertinent items are noted in HPI.   Objective:    BP 111/76  Pulse 65  Temp(Src) 98.9 F (37.2 C)  Ht 5\' 4"  (1.626 m)  Wt 179 lb (81.194 kg)  BMI 30.71 kg/m2  Breastfeeding? No        Assessment:    Doing well  Plan:    Follow up in: 4 weeks or as needed.

## 2013-12-09 NOTE — Patient Instructions (Signed)
Contraception Choices Contraception (birth control) is the use of any methods or devices to prevent pregnancy. Below are some methods to help avoid pregnancy. HORMONAL METHODS   Contraceptive implant This is a thin, plastic tube containing progesterone hormone. It does not contain estrogen hormone. Your health care provider inserts the tube in the inner part of the upper arm. The tube can remain in place for up to 3 years. After 3 years, the implant must be removed. The implant prevents the ovaries from releasing an egg (ovulation), thickens the cervical mucus to prevent sperm from entering the uterus, and thins the lining of the inside of the uterus.  Progesterone-only injections These injections are given every 3 months by your health care provider to prevent pregnancy. This synthetic progesterone hormone stops the ovaries from releasing eggs. It also thickens cervical mucus and changes the uterine lining. This makes it harder for sperm to survive in the uterus.  Birth control pills These pills contain estrogen and progesterone hormone. They work by preventing the ovaries from releasing eggs (ovulation). They also cause the cervical mucus to thicken, preventing the sperm from entering the uterus. Birth control pills are prescribed by a health care provider.Birth control pills can also be used to treat heavy periods.  Minipill This type of birth control pill contains only the progesterone hormone. They are taken every day of each month and must be prescribed by your health care provider.  Birth control patch The patch contains hormones similar to those in birth control pills. It must be changed once a week and is prescribed by a health care provider.  Vaginal ring The ring contains hormones similar to those in birth control pills. It is left in the vagina for 3 weeks, removed for 1 week, and then a new one is put back in place. The patient must be comfortable inserting and removing the ring from the  vagina.A health care provider's prescription is necessary.  Emergency contraception Emergency contraceptives prevent pregnancy after unprotected sexual intercourse. This pill can be taken right after sex or up to 5 days after unprotected sex. It is most effective the sooner you take the pills after having sexual intercourse. Most emergency contraceptive pills are available without a prescription. Check with your pharmacist. Do not use emergency contraception as your only form of birth control. BARRIER METHODS   Female condom This is a thin sheath (latex or rubber) that is worn over the penis during sexual intercourse. It can be used with spermicide to increase effectiveness.  Female condom. This is a soft, loose-fitting sheath that is put into the vagina before sexual intercourse.  Diaphragm This is a soft, latex, dome-shaped barrier that must be fitted by a health care provider. It is inserted into the vagina, along with a spermicidal jelly. It is inserted before intercourse. The diaphragm should be left in the vagina for 6 to 8 hours after intercourse.  Cervical cap This is a round, soft, latex or plastic cup that fits over the cervix and must be fitted by a health care provider. The cap can be left in place for up to 48 hours after intercourse.  Sponge This is a soft, circular piece of polyurethane foam. The sponge has spermicide in it. It is inserted into the vagina after wetting it and before sexual intercourse.  Spermicides These are chemicals that kill or block sperm from entering the cervix and uterus. They come in the form of creams, jellies, suppositories, foam, or tablets. They do not require a   prescription. They are inserted into the vagina with an applicator before having sexual intercourse. The process must be repeated every time you have sexual intercourse. INTRAUTERINE CONTRACEPTION  Intrauterine device (IUD) This is a T-shaped device that is put in a woman's uterus during a  menstrual period to prevent pregnancy. There are 2 types:  Copper IUD This type of IUD is wrapped in copper wire and is placed inside the uterus. Copper makes the uterus and fallopian tubes produce a fluid that kills sperm. It can stay in place for 10 years.  Hormone IUD This type of IUD contains the hormone progestin (synthetic progesterone). The hormone thickens the cervical mucus and prevents sperm from entering the uterus, and it also thins the uterine lining to prevent implantation of a fertilized egg. The hormone can weaken or kill the sperm that get into the uterus. It can stay in place for 3 5 years, depending on which type of IUD is used. PERMANENT METHODS OF CONTRACEPTION  Female tubal ligation This is when the woman's fallopian tubes are surgically sealed, tied, or blocked to prevent the egg from traveling to the uterus.  Hysteroscopic sterilization This involves placing a small coil or insert into each fallopian tube. Your doctor uses a technique called hysteroscopy to do the procedure. The device causes scar tissue to form. This results in permanent blockage of the fallopian tubes, so the sperm cannot fertilize the egg. It takes about 3 months after the procedure for the tubes to become blocked. You must use another form of birth control for these 3 months.  Female sterilization This is when the female has the tubes that carry sperm tied off (vasectomy).This blocks sperm from entering the vagina during sexual intercourse. After the procedure, the man can still ejaculate fluid (semen). NATURAL PLANNING METHODS  Natural family planning This is not having sexual intercourse or using a barrier method (condom, diaphragm, cervical cap) on days the woman could become pregnant.  Calendar method This is keeping track of the length of each menstrual cycle and identifying when you are fertile.  Ovulation method This is avoiding sexual intercourse during ovulation.  Symptothermal method This is  avoiding sexual intercourse during ovulation, using a thermometer and ovulation symptoms.  Post ovulation method This is timing sexual intercourse after you have ovulated. Regardless of which type or method of contraception you choose, it is important that you use condoms to protect against the transmission of sexually transmitted infections (STIs). Talk with your health care provider about which form of contraception is most appropriate for you. Document Released: 11/24/2005 Document Revised: 07/27/2013 Document Reviewed: 05/19/2013 [employer] Patient Information 2014 [employer].  

## 2014-01-04 ENCOUNTER — Ambulatory Visit (INDEPENDENT_AMBULATORY_CARE_PROVIDER_SITE_OTHER): Payer: Medicaid Other | Admitting: Obstetrics & Gynecology

## 2014-01-04 ENCOUNTER — Encounter: Payer: Self-pay | Admitting: Obstetrics & Gynecology

## 2014-01-04 VITALS — BP 111/72 | HR 78 | Temp 98.2°F | Wt 178.0 lb

## 2014-01-04 DIAGNOSIS — Z309 Encounter for contraceptive management, unspecified: Secondary | ICD-10-CM

## 2014-01-04 DIAGNOSIS — Z3202 Encounter for pregnancy test, result negative: Secondary | ICD-10-CM

## 2014-01-04 DIAGNOSIS — IMO0001 Reserved for inherently not codable concepts without codable children: Secondary | ICD-10-CM

## 2014-01-04 LAB — POCT URINE PREGNANCY: Preg Test, Ur: NEGATIVE

## 2014-01-04 MED ORDER — MEDROXYPROGESTERONE ACETATE 150 MG/ML IM SUSP: 150.0000 mg | INTRAMUSCULAR | Status: DC

## 2014-01-04 NOTE — Progress Notes (Signed)
Subjective:     Tina Mays is a 23 y.o. female who presents for a postpartum visit. She is 7 weeks postpartum following a spontaneous vaginal delivery. I have fully reviewed the prenatal and intrapartum course. The delivery was at 37 gestational weeks. Outcome: spontaneous vaginal delivery. Anesthesia: epidural. Postpartum course has been going well. Baby's course has been going well. Baby is feeding by bottle: [employer]. Bleeding pt has had some heavy to light bleeding this month.  Pt states that she had 3 days of heavy bleeding this month.  Pt states about a week after that she had some spotting.  Pt would like to discuss heavy bleeding, what to expect with cycles. Bowel function is normal. Bladder function is normal. Patient is sexually active. Contraception method is none.  Pt would like to discuss birth control.   Postpartum depression screening: negative.  The following portions of the patient's history were reviewed and updated as appropriate: allergies, current medications, past family history, past medical history, past social history, past surgical history and problem list.  Review of Systems Pertinent items are noted in HPI.   Objective:    BP 111/72  Pulse 78  Temp(Src) 98.2 F (36.8 C)  Wt 178 lb (80.74 kg)  LMP 12/26/2013  General:  alert, cooperative and appears stated age  Abdomen: soft, non-tender; bowel sounds normal; no masses,  no organomegaly   Vulva:  normal  Vagina: normal vagina, no discharge, exudate, lesion, or erythema  Cervix:  no lesions        Assessment:    Normal postpartum exam. Pap smear done at today's visit.  Plan:    1. Contraception: Depo-Provera injections 2. Follow up in: 1 week for Depo injection

## 2014-01-05 ENCOUNTER — Other Ambulatory Visit: Payer: Self-pay | Admitting: *Deleted

## 2014-01-05 DIAGNOSIS — N390 Urinary tract infection, site not specified: Secondary | ICD-10-CM

## 2014-01-05 LAB — PAP IG AND CT-NG NAA
CHLAMYDIA PROBE AMP: NEGATIVE
GC Probe Amp: NEGATIVE

## 2014-01-05 MED ORDER — SULFAMETHOXAZOLE-TMP DS 800-160 MG PO TABS: 1.0000 | ORAL_TABLET | Freq: Two times a day (BID) | ORAL | Status: DC

## 2014-01-09 ENCOUNTER — Encounter: Payer: Self-pay | Admitting: Obstetrics & Gynecology

## 2014-01-11 ENCOUNTER — Ambulatory Visit (INDEPENDENT_AMBULATORY_CARE_PROVIDER_SITE_OTHER): Payer: Medicaid Other | Admitting: *Deleted

## 2014-01-11 VITALS — BP 120/79 | HR 73 | Temp 98.8°F | Ht 64.0 in | Wt 176.0 lb

## 2014-01-11 DIAGNOSIS — IMO0001 Reserved for inherently not codable concepts without codable children: Secondary | ICD-10-CM

## 2014-01-11 DIAGNOSIS — Z3202 Encounter for pregnancy test, result negative: Secondary | ICD-10-CM

## 2014-01-11 DIAGNOSIS — Z309 Encounter for contraceptive management, unspecified: Secondary | ICD-10-CM

## 2014-01-11 LAB — POCT URINE PREGNANCY: Preg Test, Ur: NEGATIVE

## 2014-01-11 MED ORDER — MEDROXYPROGESTERONE ACETATE 150 MG/ML IM SUSP
150.0000 mg | INTRAMUSCULAR | Status: AC
  Administered 2014-01-11 – 2014-04-10 (×2): 150 mg via INTRAMUSCULAR

## 2014-01-11 NOTE — Progress Notes (Signed)
Patient in office today for Depo shot. UPT was negative. Depo given in left upper outer quadrant.

## 2014-04-10 ENCOUNTER — Ambulatory Visit (INDEPENDENT_AMBULATORY_CARE_PROVIDER_SITE_OTHER): Payer: Medicaid Other | Admitting: *Deleted

## 2014-04-10 VITALS — BP 109/73 | HR 70 | Temp 98.3°F | Wt 170.0 lb

## 2014-04-10 DIAGNOSIS — Z309 Encounter for contraceptive management, unspecified: Secondary | ICD-10-CM

## 2014-04-10 DIAGNOSIS — IMO0001 Reserved for inherently not codable concepts without codable children: Secondary | ICD-10-CM

## 2014-04-10 NOTE — Progress Notes (Signed)
Pt is in office today for depo injection.  Injection given in right upper outer quadrant.  Pt tolerated well. Pt advised to RTO on 07/02/14 for next injection.

## 2014-05-30 IMAGING — US US OB DETAIL+14 WK
1 series · 12 of 28 positions shown · non-contrast
Comparison: none

[Series 1: us ob detail+14 wk · 0.19mm/px · 84 acquisitions, 12 frames shown]
[im 4/84]
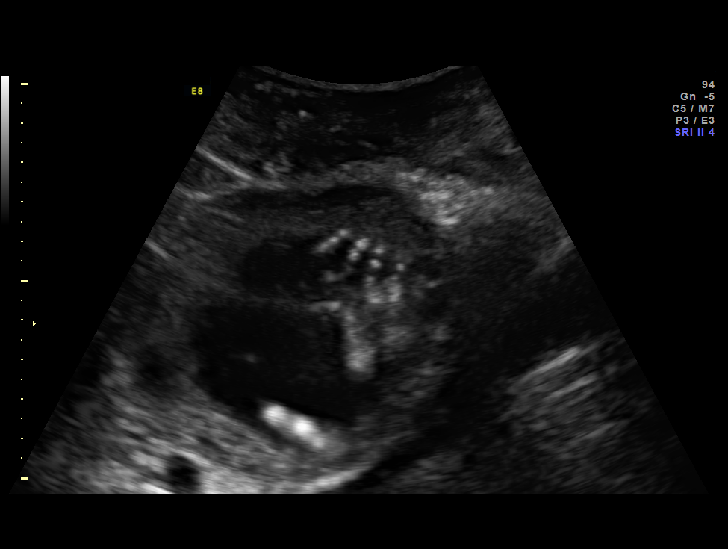
[im 10/84]
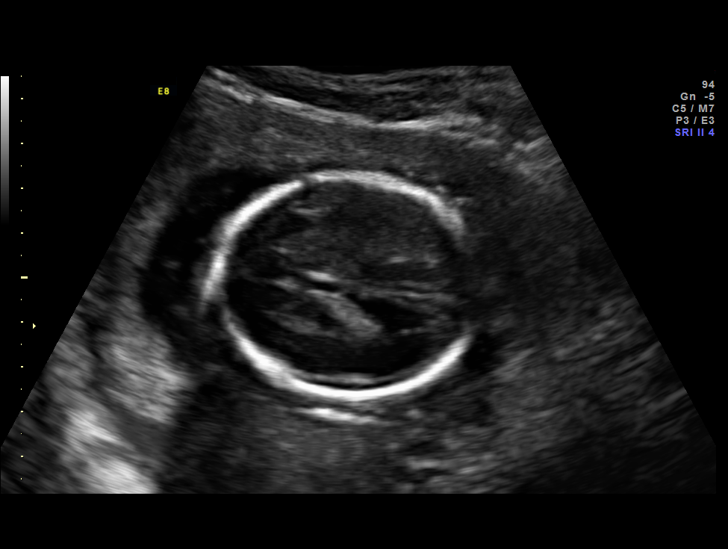
[im 16/84]
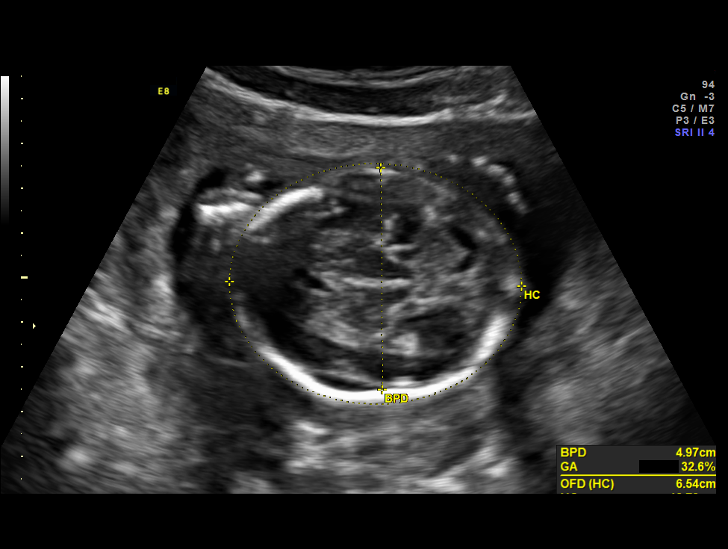
[im 25/84]
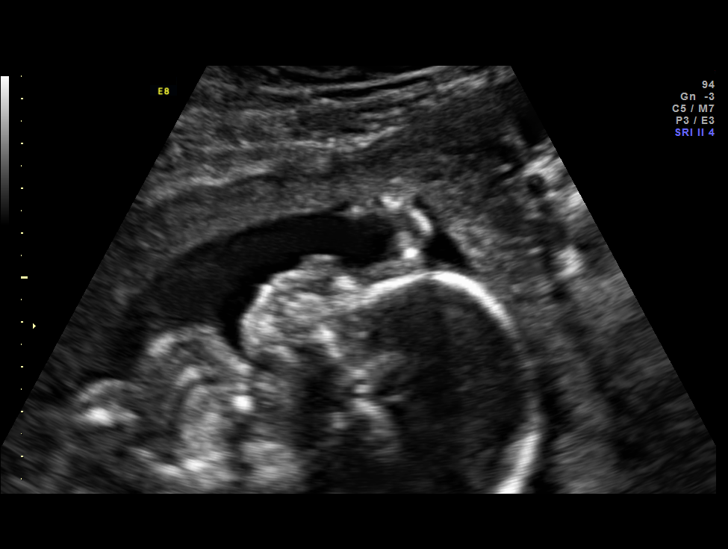
[im 31/84]
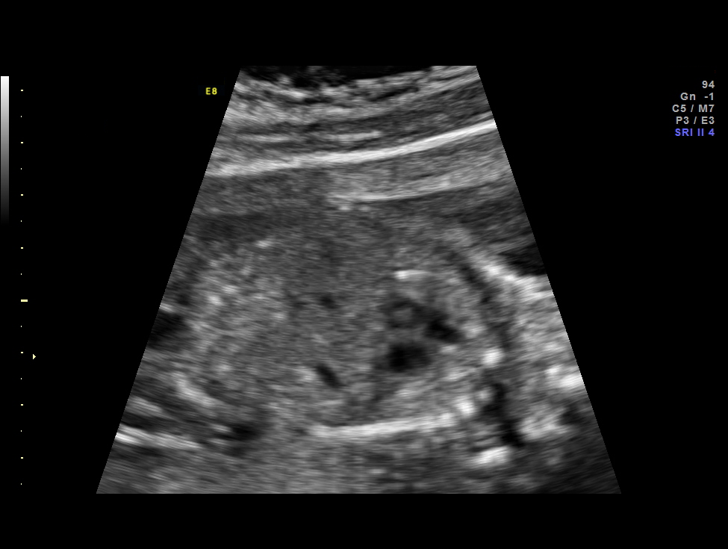
[im 37/84]
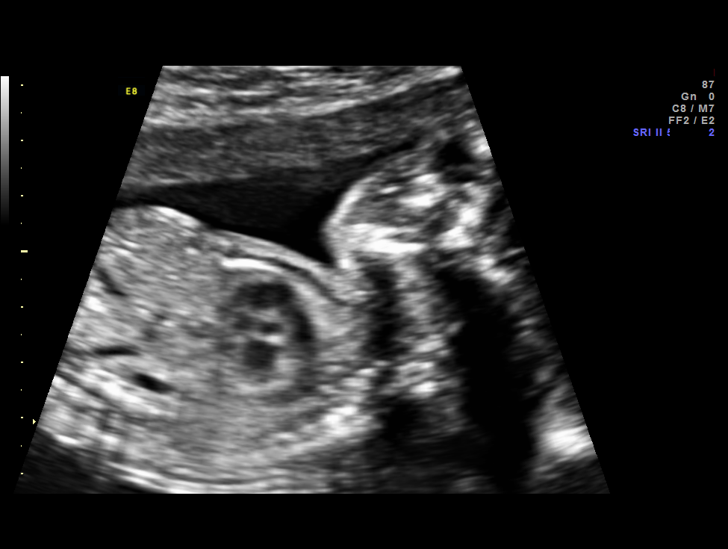
[im 47/84]
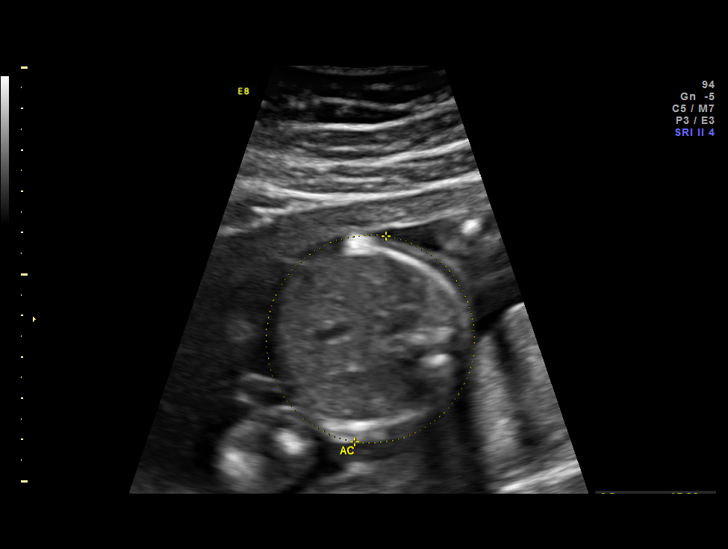
[im 53/84]
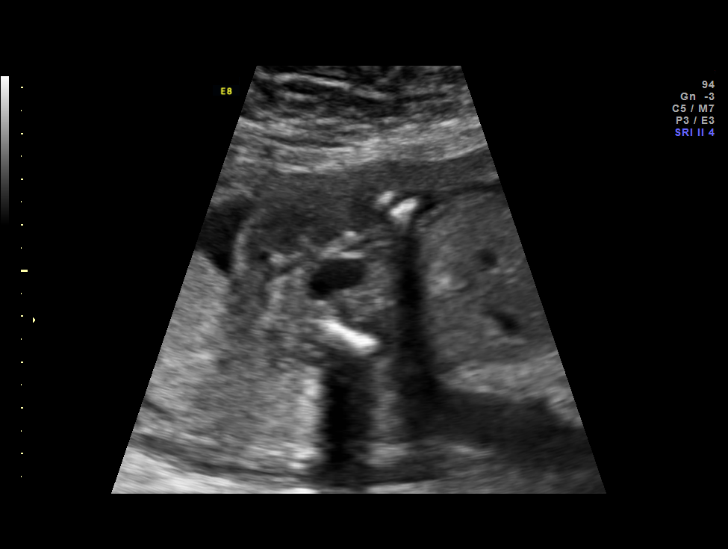
[im 59/84]
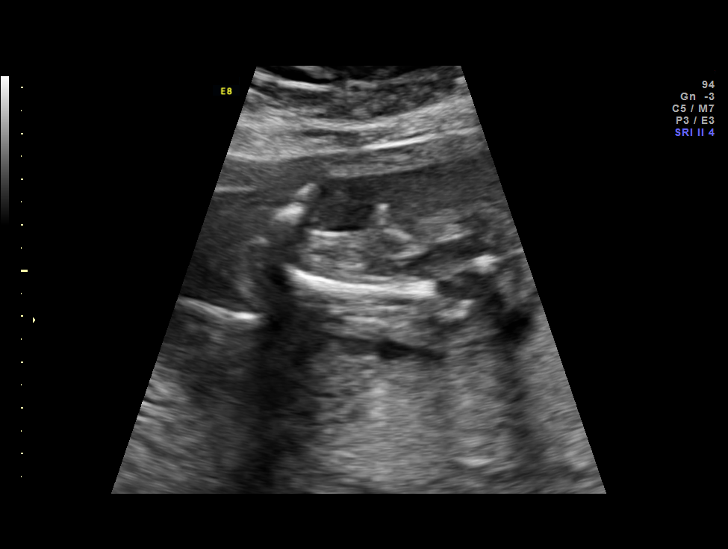
[im 68/84]
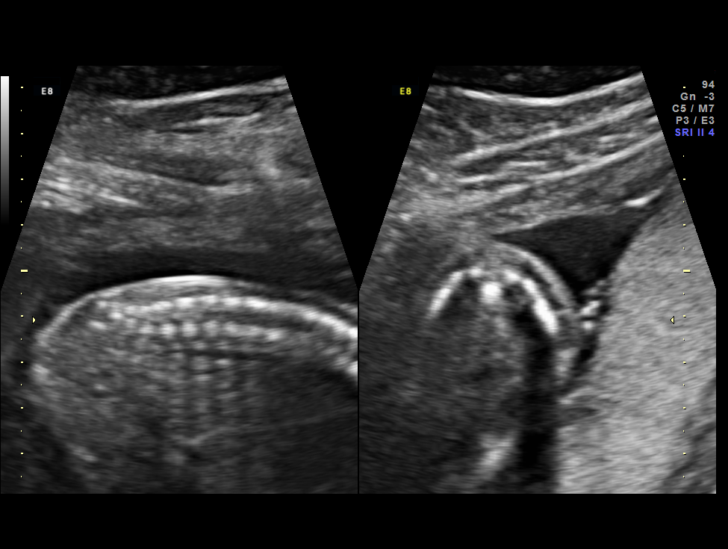
[im 74/84]
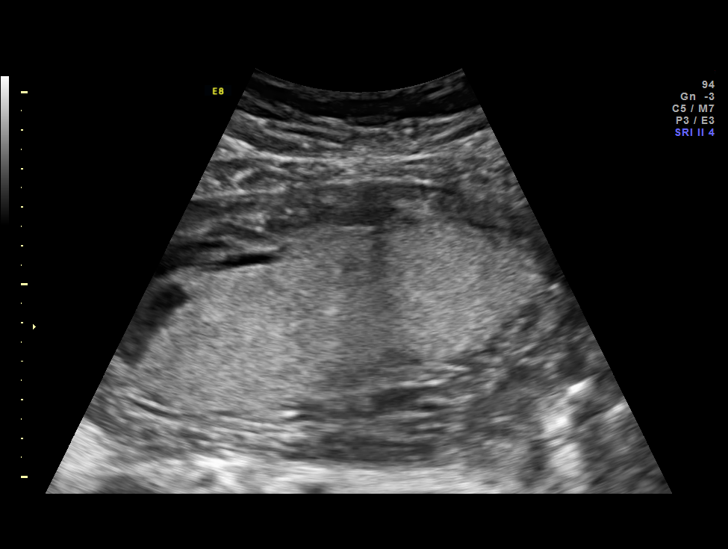
[im 80/84]
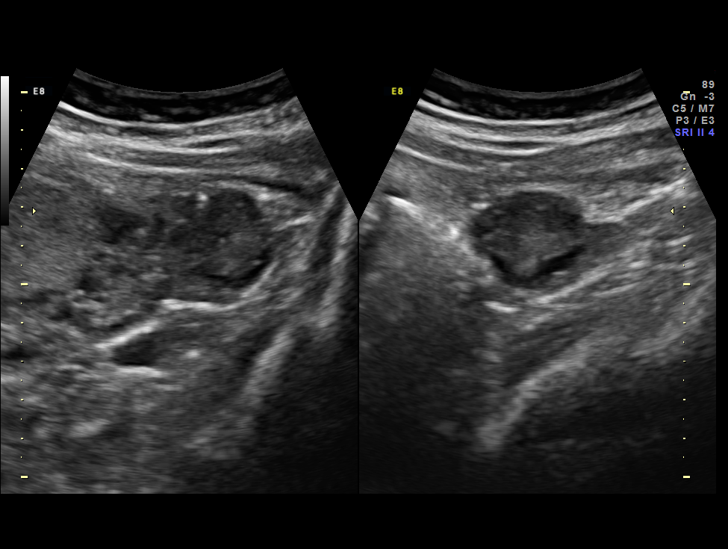

[12 of 28 positions shown; findings below may reference images not displayed]

OBSTETRICS REPORT
                      (Signed Final 07/27/2013 [DATE])

Service(s) Provided

 US OB DETAIL + 14 WK                                  76811.0
Indications

 Detailed fetal anatomic survey
 Abnormal biochemical screen (quad) for Trisomy
 21 ([DATE])
Fetal Evaluation

 Num Of Fetuses:    1
 Fetal Heart Rate:  132                          bpm
 Cardiac Activity:  Observed
 Presentation:      Cephalic
 Placenta:          Posterior, above cervical
                    os
 P. Cord            Visualized, central
 Insertion:

 Amniotic Fluid
 AFI FV:      Subjectively within normal limits
                                             Larg Pckt:     5.6  cm
Biometry

 BPD:     49.6  mm     G. Age:  21w 0d                CI:         74.8   70 - 86
 OFD:     66.3  mm                                    FL/HC:      18.8   15.9 -

 HC:     188.8  mm     G. Age:  21w 1d       29  %    HC/AC:      1.20   1.06 -

 AC:     157.7  mm     G. Age:  20w 6d       26  %    FL/BPD:
 FL:      35.4  mm     G. Age:  21w 1d       32  %    FL/AC:      22.4   20 - 24
 HUM:     32.6  mm     G. Age:  21w 0d       36  %

 Est. FW:     396  gm    0 lb 14 oz      35  %
Gestational Age

 LMP:           21w 3d        Date:  02/27/13                 EDD:   12/04/13
 U/S Today:     21w 0d                                        EDD:   12/07/13
 Best:          21w 3d     Det. By:  LMP  (02/27/13)          EDD:   12/04/13
Anatomy
 Cranium:          Appears normal         Aortic Arch:      Appears normal
 Fetal Cavum:      Appears normal         Ductal Arch:      Appears normal
 Ventricles:       Appears normal         Diaphragm:        Appears normal
 Choroid Plexus:   Appears normal         Stomach:          Appears normal, left
                                                            sided
 Cerebellum:       Appears normal         Abdomen:          Appears normal
 Posterior Fossa:  Appears normal         Abdominal Wall:   Appears nml (cord
                                                            insert, abd wall)
 Nuchal Fold:      Not applicable (>20    Cord Vessels:     Appears normal (3
                   wks GA)                                  vessel cord)
 Face:             Appears normal         Kidneys:          Appear normal
                   (orbits and profile)
 Lips:             Appears normal         Bladder:          Appears normal
 Heart:            Appears normal         Spine:            Appears normal
                   (4CH, axis, and
                   situs)
 RVOT:             Appears normal         Lower             Appears normal
                                          Extremities:
 LVOT:             Appears normal         Upper             Appears normal
                                          Extremities:

 Other:  Heels and 5th digit visualized. Nasal bone visualized. Fetus appears
         to be a male.
Cervix Uterus Adnexa

 Cervical Length:    3.7      cm

 Cervix:       Normal appearance by transabdominal scan.
 Uterus:       No abnormality visualized.
 Cul De Sac:   No free fluid seen.
 Left Ovary:    Within normal limits.
 Right Ovary:   Within normal limits.

 Adnexa:     No abnormality visualized.
Comments

 Ms. Albertyn was seen today due to a positive quad screen
 for Trisomy 21 risk ([DATE]).  See separate note from Genetics
 counselor.  Normal detailed fetal anatomy noted.  No markers
 associated with Down syndrome were appreciated.  After
 counseling, the patient declined further genetic testing (NIPS,
 amniocentesis)
Impression

 Single IUP at 21 [DATE] weeks
 Normal detailed fetal anatomy
 No markers associated with aneuploidy noted
 Normal amniotic fluid
Recommendations

 Follow-up ultrasounds as clinically indicated.

## 2014-07-03 ENCOUNTER — Ambulatory Visit: Payer: Medicaid Other

## 2014-08-16 IMAGING — US US OB FOLLOW-UP
1 series · 12 of 16 positions shown · non-contrast
Comparison: none

[Series 1: us ob follow up · 16 acquisitions, 12 frames shown]
[im 1/16]
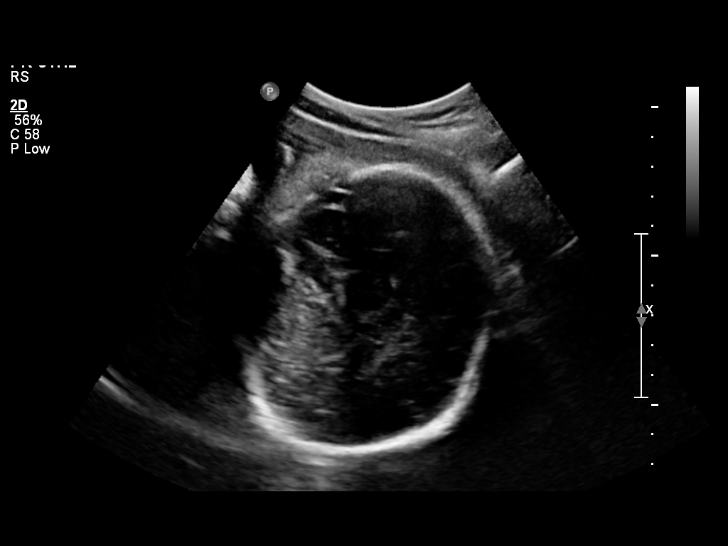
[im 3/16]
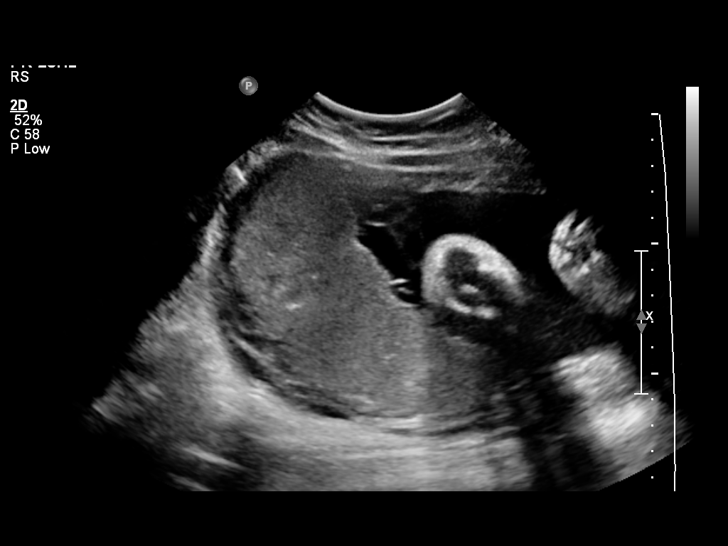
[im 4/16]
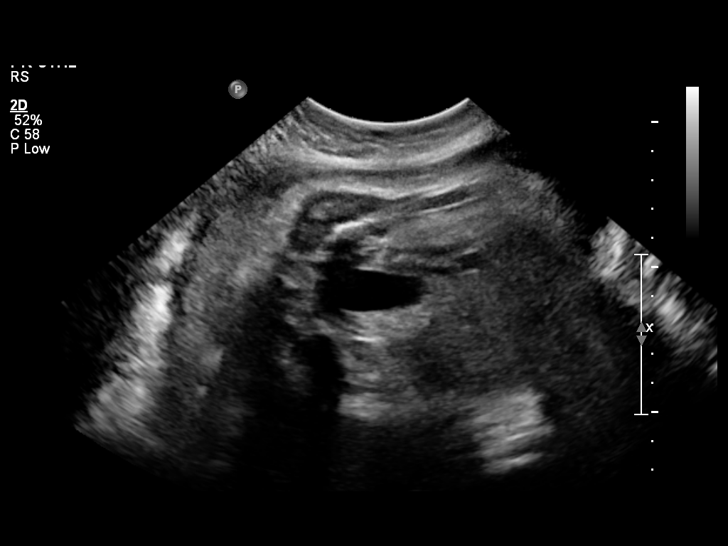
[im 5/16]
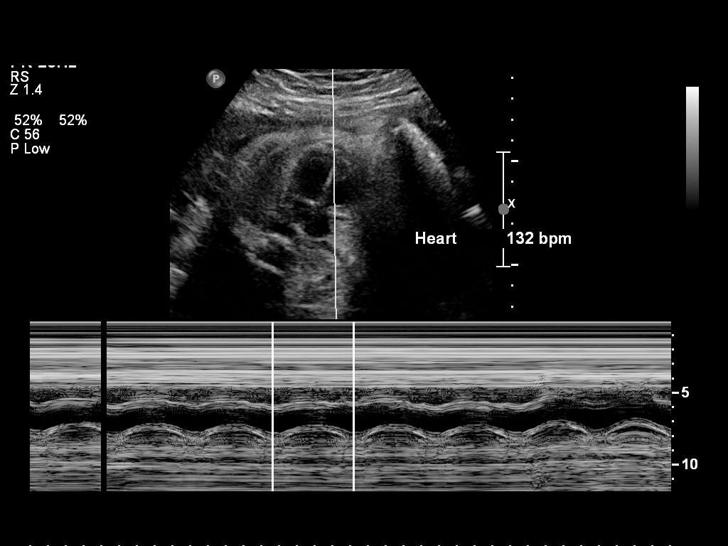
[im 7/16]
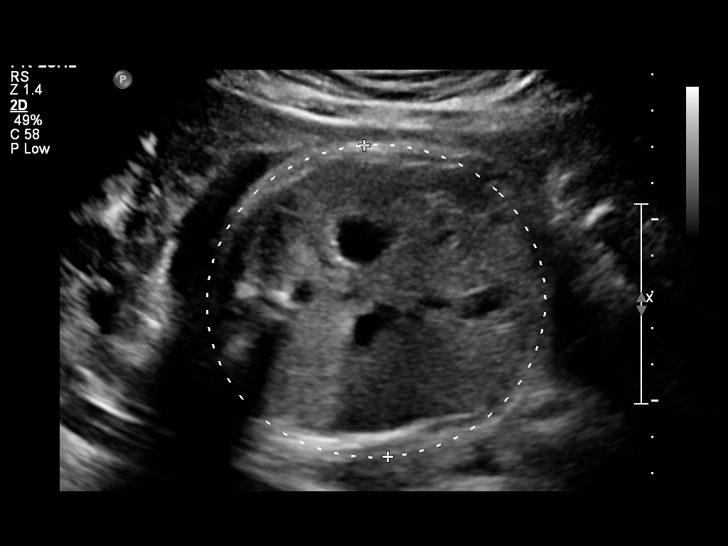
[im 8/16]
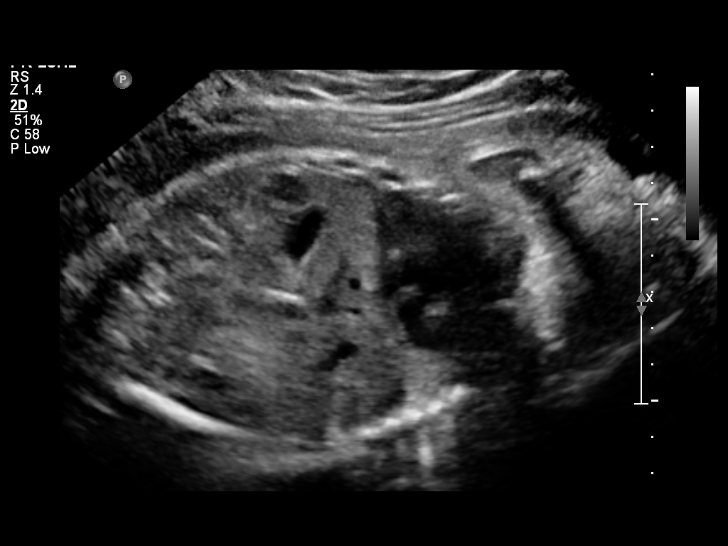
[im 9/16]
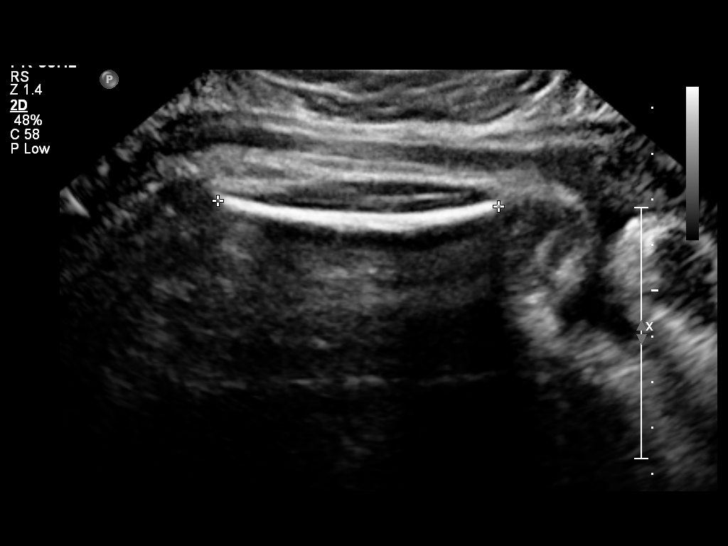
[im 11/16]
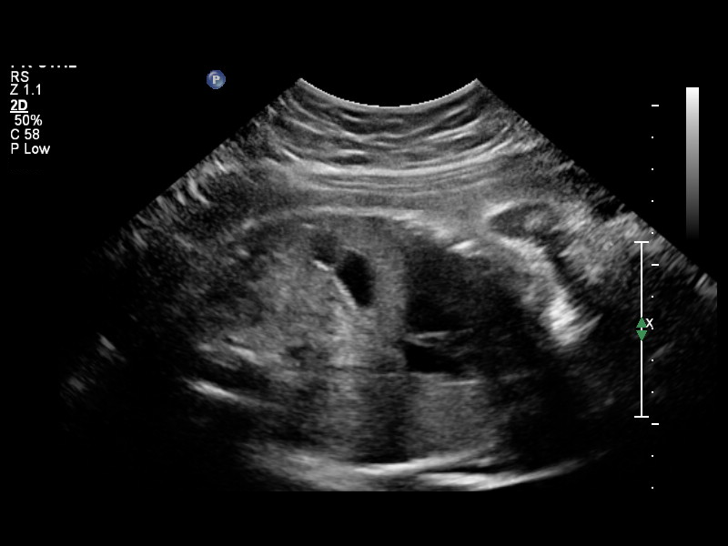
[im 12/16]
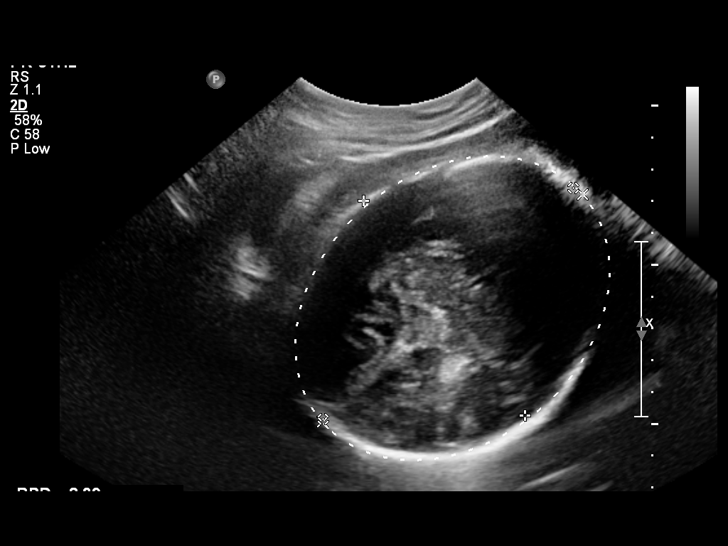
[im 13/16]
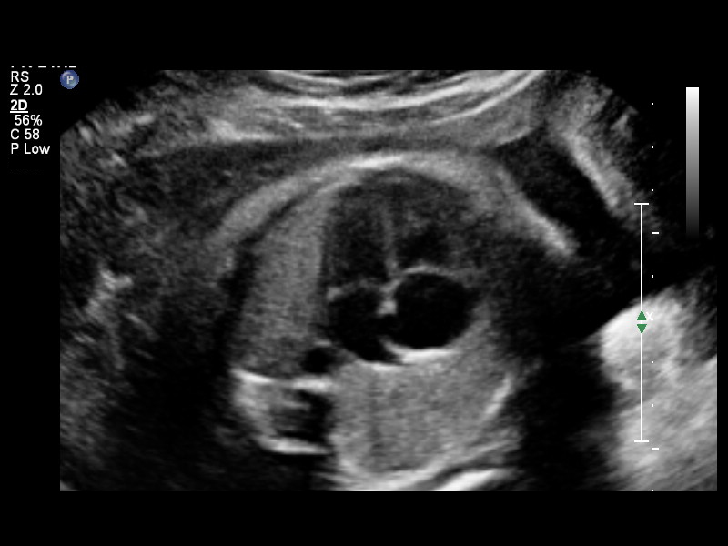
[im 15/16]
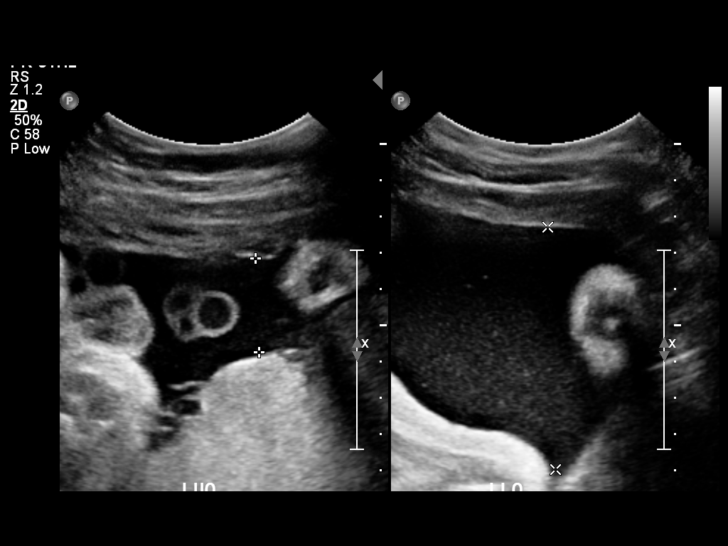
[im 16/16]
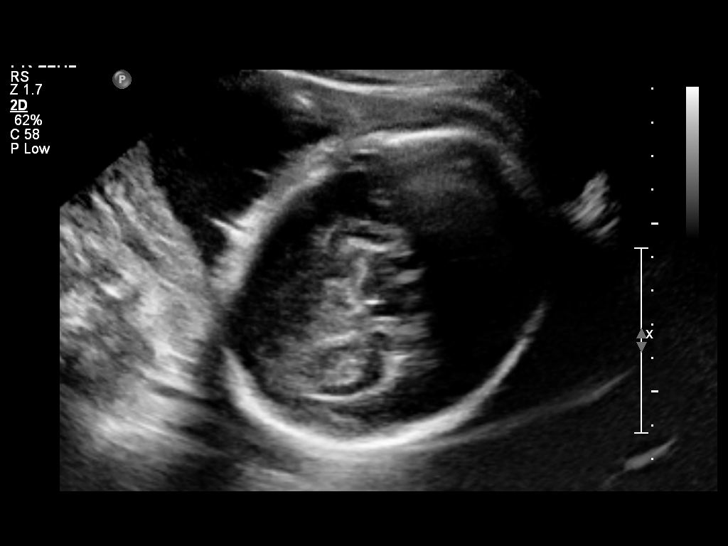

[12 of 16 positions shown; findings below may reference images not displayed]

OBSTETRICS REPORT

Service(s) Provided

 US OB FOLLOW UP                                       76816.1
Indications

 Size less than dates (Small for gestational [AGE]
 FGR)
 Pain - Abdominal/Pelvic
Fetal Evaluation

 Num Of Fetuses:    1
 Fetal Heart Rate:  132                          bpm
 Cardiac Activity:  Observed
 Presentation:      Cephalic
 Placenta:          Posterior, above cervical
                    os

 Amniotic Fluid
 AFI FV:      Subjectively within normal limits
 AFI Sum:     16.56   cm       60  %Tile     Larg Pckt:    6.66  cm
 RUQ:   3.2     cm   RLQ:    4.12   cm    LUQ:   2.58    cm   LLQ:    6.66   cm
Biometry

 BPD:     83.5  mm     G. Age:  33w 4d                CI:         78.5   70 - 86
 OFD:    106.4  mm                                    FL/HC:      20.6   19.9 -

 HC:     301.8  mm     G. Age:  33w 3d       37  %    HC/AC:      1.07   0.96 -

 AC:     281.4  mm     G. Age:  32w 1d       39  %    FL/BPD:     74.4   71 - 87
 FL:      62.1  mm     G. Age:  32w 1d       28  %    FL/AC:      22.1   20 - 24

 Est. FW:    5896  gm      4 lb 6 oz     54  %
Gestational Age

 LMP:           32w 4d        Date:  02/27/13                 EDD:   12/04/13
 U/S Today:     32w 6d                                        EDD:   12/02/13
 Best:          32w 4d     Det. By:  LMP  (02/27/13)          EDD:   12/04/13
Anatomy

 Cranium:          Appears normal         Aortic Arch:      Previously seen
 Fetal Cavum:      Appears normal         Ductal Arch:      Previously seen
 Ventricles:       Appears normal         Diaphragm:        Appears normal
 Choroid Plexus:   Previously seen        Stomach:          Appears normal, left
                                                            sided
 Cerebellum:       Previously seen        Abdomen:          Previously seen
 Posterior Fossa:  Previously seen        Abdominal Wall:   Previously seen
 Nuchal Fold:      Not applicable (>20    Cord Vessels:     Previously seen
                   wks GA)
 Face:             Orbits and profile     Kidneys:          Appear normal
                   previously seen
 Lips:             Previously seen        Bladder:          Appears normal
 Heart:            Appears normal         Spine:            Previously seen
                   (4CH, axis, and
                   situs)
 RVOT:             Previously seen        Lower             Previously seen
                                          Extremities:
 LVOT:             Previously seen        Upper             Previously seen
                                          Extremities:
Cervix Uterus Adnexa

 Cervix:       Not visualized (advanced GA >95wks)
Impression

 Single living intrauterine pregnancy at 32 weeks 4 days.
 Appropriate interval fetal growth (54%).
 Normal amniotic fluid volume.
 Normal interval fetal anatomy.
Recommendations

 Follow-up ultrasounds as clinically indicated.

                 Attending Physician, SEBASTIAN

## 2014-12-04 ENCOUNTER — Encounter: Payer: Self-pay | Admitting: *Deleted

## 2016-04-02 ENCOUNTER — Encounter: Payer: Self-pay | Admitting: Obstetrics

## 2016-04-02 ENCOUNTER — Ambulatory Visit (INDEPENDENT_AMBULATORY_CARE_PROVIDER_SITE_OTHER): Payer: Medicaid Other | Admitting: Obstetrics

## 2016-04-02 VITALS — BP 111/83 | HR 84 | Ht 63.0 in | Wt 159.0 lb

## 2016-04-02 DIAGNOSIS — Z01419 Encounter for gynecological examination (general) (routine) without abnormal findings: Secondary | ICD-10-CM | POA: Diagnosis not present

## 2016-04-02 DIAGNOSIS — Z0000 Encounter for general adult medical examination without abnormal findings: Secondary | ICD-10-CM

## 2016-04-02 DIAGNOSIS — Z3009 Encounter for other general counseling and advice on contraception: Secondary | ICD-10-CM

## 2016-04-04 ENCOUNTER — Encounter: Payer: Self-pay | Admitting: Obstetrics

## 2016-04-04 LAB — PAP IG W/ RFLX HPV ASCU: PAP Smear Comment: 0

## 2016-04-04 NOTE — Progress Notes (Signed)
Subjective:        Tina Mays is a 25 y.o. female here for a routine exam.  Current complaints: None.    Personal health questionnaire:  Is patient Ashkenazi Jewish, have a family history of breast and/or ovarian cancer: no Is there a family history of uterine cancer diagnosed at age < 7550, gastrointestinal cancer, urinary tract cancer, family member who is a Personnel officerLynch syndrome-associated carrier: no Is the patient overweight and hypertensive, family history of diabetes, personal history of gestational diabetes, preeclampsia or PCOS: no Is patient over 8055, have PCOS,  family history of premature CHD under age 26, diabetes, smoke, have hypertension or peripheral artery disease:  no At any time, has a partner hit, kicked or otherwise hurt or frightened you?: no Over the past 2 weeks, have you felt down, depressed or hopeless?: no Over the past 2 weeks, have you felt little interest or pleasure in doing things?:no   Gynecologic History Patient's last menstrual period was 03/04/2016. Contraception: none Last Pap: 2015. Results were: normal Last mammogram: n/a. Results were: n/a  Obstetric History OB History  Gravida Para Term Preterm AB SAB TAB Ectopic Multiple Living  4 2 2  2 1    2     # Outcome Date GA Lbr Len/2nd Weight Sex Delivery Anes PTL Lv  4 Term 11/14/13 9234w1d 12:27 / 00:15 6 lb 12.5 oz (3.075 kg) M Vag-Spont EPI  Y  3 AB 06/07/09 7343w0d         2 Term 12/08/08 3315w0d  6 lb 8 oz (2.948 kg) F Vag-Spont EPI  Y     Comments: Pre-eclampsia.  Induced around 38 weeks.  1 SAB 05/09/07 6943w0d             Past Medical History  Diagnosis Date  . Pre-eclampsia     Past Surgical History  Procedure Laterality Date  . Wisdom tooth extraction      No current outpatient prescriptions on file. No Known Allergies  Social History  Substance Use Topics  . Smoking status: Never Smoker   . Smokeless tobacco: Never Used  . Alcohol Use: 0.0 oz/week    0 Standard drinks or  equivalent per week    Family History  Problem Relation Age of Onset  . Diabetes Mother   . Diabetes Father   . Hypertension Father   . Stroke Father   . Heart disease Father       Review of Systems  Constitutional: negative for fatigue and weight loss Respiratory: negative for cough and wheezing Cardiovascular: negative for chest pain, fatigue and palpitations Gastrointestinal: negative for abdominal pain and change in bowel habits Musculoskeletal:negative for myalgias Neurological: negative for gait problems and tremors Behavioral/Psych: negative for abusive relationship, depression Endocrine: negative for temperature intolerance   Genitourinary:negative for abnormal menstrual periods, genital lesions, hot flashes, sexual problems and vaginal discharge Integument/breast: negative for breast lump, breast tenderness, nipple discharge and skin lesion(s)    Objective:       BP 111/83 mmHg  Pulse 84  Ht 5\' 3"  (1.6 m)  Wt 159 lb (72.122 kg)  BMI 28.17 kg/m2  LMP 03/04/2016 General:   alert  Skin:   no rash or abnormalities  Lungs:   clear to auscultation bilaterally  Heart:   regular rate and rhythm, S1, S2 normal, no murmur, click, rub or gallop  Breasts:   normal without suspicious masses, skin or nipple changes or axillary nodes  Abdomen:  normal findings: no organomegaly, soft,  non-tender and no hernia  Pelvis:  External genitalia: normal general appearance Urinary system: urethral meatus normal and bladder without fullness, nontender Vaginal: normal without tenderness, induration or masses Cervix: normal appearance Adnexa: normal bimanual exam Uterus: anteverted and non-tender, normal size   Lab Review Urine pregnancy test Labs reviewed yes Radiologic studies reviewed no    Assessment:    Healthy female exam.    Contraceptive counseling and advice   Plan:    Education reviewed: low fat, low cholesterol diet, safe sex/STD prevention, self breast exams and  weight bearing exercise. Contraception: none. Follow up in: 1 year.   No orders of the defined types were placed in this encounter.   Orders Placed This Encounter  Procedures  . NuSwab Vaginitis Plus (VG+)

## 2016-04-05 ENCOUNTER — Other Ambulatory Visit: Payer: Self-pay | Admitting: Obstetrics

## 2016-04-05 DIAGNOSIS — N760 Acute vaginitis: Principal | ICD-10-CM

## 2016-04-05 DIAGNOSIS — B373 Candidiasis of vulva and vagina: Secondary | ICD-10-CM

## 2016-04-05 DIAGNOSIS — B3731 Acute candidiasis of vulva and vagina: Secondary | ICD-10-CM

## 2016-04-05 DIAGNOSIS — B9689 Other specified bacterial agents as the cause of diseases classified elsewhere: Secondary | ICD-10-CM

## 2016-04-05 LAB — NUSWAB VAGINITIS PLUS (VG+)
Atopobium vaginae: HIGH Score — AB
BVAB 2: HIGH {score} — AB
CANDIDA ALBICANS, NAA: NEGATIVE
CHLAMYDIA TRACHOMATIS, NAA: NEGATIVE
Candida glabrata, NAA: NEGATIVE
MEGASPHAERA 1: HIGH {score} — AB
Neisseria gonorrhoeae, NAA: NEGATIVE
Trich vag by NAA: NEGATIVE

## 2016-04-05 MED ORDER — FLUCONAZOLE 150 MG PO TABS: 150.0000 mg | ORAL_TABLET | Freq: Once | ORAL | Status: DC

## 2016-04-05 MED ORDER — METRONIDAZOLE 500 MG PO TABS: 500.0000 mg | ORAL_TABLET | Freq: Two times a day (BID) | ORAL | Status: DC

## 2016-04-07 ENCOUNTER — Encounter: Payer: Self-pay | Admitting: *Deleted

## 2016-04-16 ENCOUNTER — Encounter: Payer: Self-pay | Admitting: Obstetrics

## 2016-04-16 ENCOUNTER — Ambulatory Visit (INDEPENDENT_AMBULATORY_CARE_PROVIDER_SITE_OTHER): Payer: Medicaid Other | Admitting: Obstetrics

## 2016-04-16 VITALS — BP 105/59 | HR 67 | Temp 98.7°F | Wt 160.0 lb

## 2016-04-16 DIAGNOSIS — Z3009 Encounter for other general counseling and advice on contraception: Secondary | ICD-10-CM

## 2016-04-16 DIAGNOSIS — Z30430 Encounter for insertion of intrauterine contraceptive device: Secondary | ICD-10-CM

## 2016-04-16 DIAGNOSIS — Z3202 Encounter for pregnancy test, result negative: Secondary | ICD-10-CM | POA: Diagnosis not present

## 2016-04-16 LAB — POCT URINE PREGNANCY: Preg Test, Ur: NEGATIVE

## 2016-04-16 NOTE — Progress Notes (Signed)
Pt wan not seen by provider today.  Pt was in office for IUD insetion.  Pt UPT in office was negative today. Pt states that she had intercourse 2 days ago with no protection.  Pt was made aware that per Dr Clearance CootsHarper she would not be able to get IUD today.  Pt was advised to abstain from intercourse for 2 weeks and return to office for repeat UPT and IUD insertion. Pt states understanding.

## 2016-04-30 ENCOUNTER — Ambulatory Visit: Payer: Medicaid Other | Admitting: Obstetrics

## 2016-09-26 ENCOUNTER — Ambulatory Visit (INDEPENDENT_AMBULATORY_CARE_PROVIDER_SITE_OTHER): Payer: Medicaid Other | Admitting: *Deleted

## 2016-09-26 DIAGNOSIS — Z3201 Encounter for pregnancy test, result positive: Secondary | ICD-10-CM | POA: Diagnosis not present

## 2016-09-26 DIAGNOSIS — N912 Amenorrhea, unspecified: Secondary | ICD-10-CM

## 2016-09-26 DIAGNOSIS — Z3491 Encounter for supervision of normal pregnancy, unspecified, first trimester: Secondary | ICD-10-CM

## 2016-09-26 LAB — POCT URINE PREGNANCY: Preg Test, Ur: POSITIVE — AB

## 2016-09-26 MED ORDER — VITAFOL ULTRA 29-0.6-0.4-200 MG PO CAPS: 1.0000 | ORAL_CAPSULE | Freq: Every day | ORAL | 11 refills | Status: DC

## 2016-10-16 ENCOUNTER — Encounter: Payer: Medicaid Other | Admitting: Certified Nurse Midwife

## 2016-10-17 ENCOUNTER — Encounter (HOSPITAL_COMMUNITY): Payer: Self-pay | Admitting: Family Medicine

## 2016-10-17 ENCOUNTER — Ambulatory Visit (HOSPITAL_COMMUNITY)
Admission: EM | Admit: 2016-10-17 | Discharge: 2016-10-17 | Disposition: A | Discharge status: Home / Self Care | Payer: Medicaid Other | Attending: Emergency Medicine | Admitting: Emergency Medicine

## 2016-10-17 DIAGNOSIS — Z202 Contact with and (suspected) exposure to infections with a predominantly sexual mode of transmission: Secondary | ICD-10-CM | POA: Insufficient documentation

## 2016-10-17 DIAGNOSIS — Z3A12 12 weeks gestation of pregnancy: Secondary | ICD-10-CM | POA: Diagnosis not present

## 2016-10-17 MED ORDER — CEFTRIAXONE SODIUM 250 MG IJ SOLR
INTRAMUSCULAR | Status: AC
  Filled 2016-10-17: qty 250

## 2016-10-17 MED ORDER — AZITHROMYCIN 250 MG PO TABS
ORAL_TABLET | ORAL | Status: AC
  Filled 2016-10-17: qty 4

## 2016-10-17 MED ORDER — CEFTRIAXONE SODIUM 250 MG IJ SOLR
250.0000 mg | Freq: Once | INTRAMUSCULAR | Status: AC
  Administered 2016-10-17: 250 mg via INTRAMUSCULAR

## 2016-10-17 MED ORDER — AZITHROMYCIN 250 MG PO TABS
1000.0000 mg | ORAL_TABLET | Freq: Once | ORAL | Status: AC
  Administered 2016-10-17: 1000 mg via ORAL

## 2016-10-17 NOTE — ED Notes (Signed)
Call back number verified and updated in EPIC... Adv pt to not have SI until lab results comeback neg.... Also adv pt lab results will be on MyChart; instructions given .... Pt verb understanding.   

## 2016-10-17 NOTE — Discharge Instructions (Signed)
°  You have been treated today in case your tests for gonorrhea or chlamydia do come back positive.  The tests take about 3-4 days to result.  If you do not hear back about the results by Tuesday or Wednesday of next week, please call our urgent care to check on them as sometimes a phone number is mistyped or voicemail boxes become full.   Refrain from sexual intercourse for 7 days. Be sure to have all partners tested and treated for STDs.  Practice safe sex by always wearing condoms.

## 2016-10-17 NOTE — ED Provider Notes (Signed)
CSN: [id number]     Arrival date & time 10/17/16  1004 History   None    Chief Complaint  Patient presents with  . Exposure to STD   (Consider location/radiation/quality/duration/timing/severity/associated sxs/prior Treatment) HPI  Tina Mays is a 25 y.o. female presenting to UC [redacted] weeks pregnant requested to be tested and treated for STIs as her boyfriend told her he was dx and treated for gonorrhea.  She denies having symptoms besides mild nausea she attributes to being pregnant.  Denies dysuria, hematuria, vaginal or abdominal pain, or vaginal discharge. She is requested to be tested for gonorrhea, chlamydia, HIV, as well as syphilis.  Denies fever, chills, vomiting or diarrhea.    Past Medical History:  Diagnosis Date  . Pre-eclampsia    Past Surgical History:  Procedure Laterality Date  . WISDOM TOOTH EXTRACTION     Family History  Problem Relation Age of Onset  . Diabetes Mother   . Diabetes Father   . Hypertension Father   . Stroke Father   . Heart disease Father    Social History  Substance Use Topics  . Smoking status: Never Smoker  . Smokeless tobacco: Never Used  . Alcohol use 0.0 oz/week   OB History    Gravida Para Term Preterm AB Living   5 2 2   2 2    SAB TAB Ectopic Multiple Live Births   1       2     Review of Systems  Constitutional: Negative for chills and fever.  Gastrointestinal: Positive for nausea. Negative for abdominal pain, diarrhea and vomiting.  Genitourinary: Negative for decreased urine volume, dysuria, frequency, hematuria, pelvic pain, urgency, vaginal bleeding, vaginal discharge and vaginal pain.  Musculoskeletal: Negative for back pain and myalgias.    Allergies  Patient has no known allergies.  Home Medications   Prior to Admission medications   Medication Sig Start Date End Date Taking? Authorizing Provider  Prenat-Fe Poly-Methfol-FA-DHA (VITAFOL ULTRA) 29-0.6-0.4-200 MG CAPS Take 1 capsule by mouth daily. 09/26/16    Roe Coombsachelle A Denney, CNM   Meds Ordered and Administered this Visit   Medications  cefTRIAXone (ROCEPHIN) injection 250 mg (250 mg Intramuscular Given 10/17/16 1038)  azithromycin (ZITHROMAX) tablet 1,000 mg (1,000 mg Oral Given 10/17/16 1038)    BP 109/58   Pulse 78   Temp 98.4 F (36.9 C)   Resp 18   LMP 03/08/2016   SpO2 100%  No data found.   Physical Exam  Constitutional: She is oriented to person, place, and time. She appears well-developed and well-nourished. No distress.  HENT:  Head: Normocephalic and atraumatic.  Eyes: EOM are normal.  Neck: Normal range of motion.  Cardiovascular: Normal rate.   Pulmonary/Chest: Effort normal.  Abdominal: Soft. She exhibits distension (pregnant abdomen c/w being [redacted] weeks pregnant). There is no tenderness. There is no guarding.  Genitourinary:  Genitourinary Comments: Chaperoned exam: normal external exam. No rashes or lesions. scant amount of white discharge. No bleeding. No CMT, adnexal tenderness or masses palpated.   Musculoskeletal: Normal range of motion.  Neurological: She is alert and oriented to person, place, and time.  Skin: Skin is warm and dry. She is not diaphoretic.  Psychiatric: She has a normal mood and affect. Her behavior is normal.  Nursing note and vitals reviewed.   Urgent Care Course   Clinical Course     Procedures (including critical care time)  Labs Review Labs Reviewed  HIV ANTIBODY (ROUTINE TESTING)  RPR  CERVICOVAGINAL  ANCILLARY ONLY    Imaging Review No results found.   MDM   1. Exposure to gonorrhea    Pt requested to be tested for STIs after boyfriend advised her he recently tested positive for gonorrhea. He has been treated. She denies symptoms at this time.  [facility]  Rocephin and Azithromycin given in UC. STI tests pending.    Junius FinnerErin O'Malley, PA-C 10/17/16 1049    Junius FinnerErin O'Malley, PA-C 10/17/16 1051

## 2016-10-17 NOTE — ED Triage Notes (Signed)
Pt sts that her boyfriend told her that he had gonorrhea and she wants to be tested. St sshe is [redacted] weeks pregnant.

## 2016-10-18 LAB — RPR: RPR Ser Ql: NONREACTIVE

## 2016-10-18 LAB — HIV ANTIBODY (ROUTINE TESTING W REFLEX): HIV Screen 4th Generation wRfx: NONREACTIVE

## 2016-10-20 LAB — CERVICOVAGINAL ANCILLARY ONLY
Chlamydia: NEGATIVE
Neisseria Gonorrhea: NEGATIVE

## 2016-10-21 LAB — CERVICOVAGINAL ANCILLARY ONLY: WET PREP (BD AFFIRM): POSITIVE — AB

## 2016-10-28 ENCOUNTER — Ambulatory Visit (INDEPENDENT_AMBULATORY_CARE_PROVIDER_SITE_OTHER): Payer: Medicaid Other | Admitting: Obstetrics

## 2016-10-28 ENCOUNTER — Other Ambulatory Visit (HOSPITAL_COMMUNITY)
Admission: RE | Admit: 2016-10-28 | Discharge: 2016-10-28 | Disposition: A | Discharge status: Home / Self Care | Payer: Medicaid Other | Source: Ambulatory Visit | Attending: Obstetrics | Admitting: Obstetrics

## 2016-10-28 ENCOUNTER — Encounter: Payer: Self-pay | Admitting: Obstetrics

## 2016-10-28 VITALS — BP 105/72 | HR 72 | Temp 98.5°F | Wt 182.3 lb

## 2016-10-28 DIAGNOSIS — Z3492 Encounter for supervision of normal pregnancy, unspecified, second trimester: Secondary | ICD-10-CM

## 2016-10-28 DIAGNOSIS — Z3480 Encounter for supervision of other normal pregnancy, unspecified trimester: Secondary | ICD-10-CM

## 2016-10-28 DIAGNOSIS — Z3687 Encounter for antenatal screening for uncertain dates: Secondary | ICD-10-CM | POA: Diagnosis not present

## 2016-10-28 DIAGNOSIS — Z01419 Encounter for gynecological examination (general) (routine) without abnormal findings: Secondary | ICD-10-CM | POA: Diagnosis present

## 2016-10-28 NOTE — Progress Notes (Signed)
Subjective:    Tina Mays is being seen today for her first obstetrical visit.  This is not a planned pregnancy. She is at 1325w2d gestation. Her obstetrical history is significant for none. Relationship with FOB: significant other. Patient does intend to breast feed. Pregnancy history fully reviewed.  The information documented in the HPI was reviewed and verified.  Menstrual History: OB History    Gravida Para Term Preterm AB Living   5 2 2   2 2    SAB TAB Ectopic Multiple Live Births   1       2      Menarche age: [age] Patient's last menstrual period was 03/08/2016.    Past Medical History:  Diagnosis Date  . Pre-eclampsia     Past Surgical History:  Procedure Laterality Date  . WISDOM TOOTH EXTRACTION       (Not in a hospital admission) No Known Allergies  Social History  Substance Use Topics  . Smoking status: Never Smoker  . Smokeless tobacco: Never Used  . Alcohol use 0.0 oz/week    Family History  Problem Relation Age of Onset  . Diabetes Mother   . Diabetes Father   . Hypertension Father   . Stroke Father   . Heart disease Father      Review of Systems Constitutional: negative for weight loss Gastrointestinal: negative for vomiting Genitourinary:negative for genital lesions and vaginal discharge and dysuria Musculoskeletal:negative for back pain Behavioral/Psych: negative for abusive relationship, depression, illegal drug usage and tobacco use    Objective:    BP 105/72   Pulse 72   Temp 98.5 F (36.9 C)   Wt 182 lb 4.8 oz (82.7 kg)   LMP 03/08/2016   BMI 32.29 kg/m  General Appearance:    Alert, cooperative, no distress, appears stated age  Head:    Normocephalic, without obvious abnormality, atraumatic  Eyes:    PERRL, conjunctiva/corneas clear, EOM's intact, fundi    benign, both eyes  Ears:    Normal TM's and external ear canals, both ears  Nose:   Nares normal, septum midline, mucosa normal, no drainage    or sinus tenderness   Throat:   Lips, mucosa, and tongue normal; teeth and gums normal  Neck:   Supple, symmetrical, trachea midline, no adenopathy;    thyroid:  no enlargement/tenderness/nodules; no carotid   bruit or JVD  Back:     Symmetric, no curvature, ROM normal, no CVA tenderness  Lungs:     Clear to auscultation bilaterally, respirations unlabored  Chest Wall:    No tenderness or deformity   Heart:    Regular rate and rhythm, S1 and S2 normal, no murmur, rub   or gallop  Breast Exam:    No tenderness, masses, or nipple abnormality  Abdomen:     Soft, non-tender, bowel sounds active all four quadrants,    no masses, no organomegaly  Genitalia:    Normal female without lesion, discharge or tenderness  Extremities:   Extremities normal, atraumatic, no cyanosis or edema  Pulses:   2+ and symmetric all extremities  Skin:   Skin color, texture, turgor normal, no rashes or lesions  Lymph nodes:   Cervical, supraclavicular, and axillary nodes normal  Neurologic:   CNII-XII intact, normal strength, sensation and reflexes    throughout      Lab Review Urine pregnancy test Labs reviewed yes Radiologic studies reviewed no Assessment:    Pregnancy at 825w2d weeks    Unsure LMP  Plan:     Ultrasound ordered for dating Prenatal vitamins.  Counseling provided regarding continued use of seat belts, cessation of alcohol consumption, smoking or use of illicit drugs; infection precautions i.e., influenza/TDAP immunizations, toxoplasmosis,CMV, parvovirus, listeria and varicella; workplace safety, exercise during pregnancy; routine dental care, safe medications, sexual activity, hot tubs, saunas, pools, travel, caffeine use, fish and methlymercury, potential toxins, hair treatments, varicose veins Weight gain recommendations per IOM guidelines reviewed: underweight/BMI< 18.5--> gain 28 - 40 lbs; normal weight/BMI 18.5 - 24.9--> gain 25 - 35 lbs; overweight/BMI 25 - 29.9--> gain 15 - 25 lbs; obese/BMI >30->gain  11  - 20 lbs Problem list reviewed and updated. FIRST/CF mutation testing/NIPT/QUAD SCREEN/fragile X/Ashkenazi Jewish population testing/Spinal muscular atrophy discussed: requested. Role of ultrasound in pregnancy discussed; fetal survey: requested. Amniocentesis discussed: not indicated. VBAC calculator score: VBAC consent form provided No orders of the defined types were placed in this encounter.  No orders of the defined types were placed in this encounter.   Follow up in 2 weeks. 50% of 20 min visit spent on counseling and coordination of care. [name], female   DOB: 04-26-1991, 25 y.o.   MRN: [id number]

## 2016-10-28 NOTE — Addendum Note (Signed)
Addended by: Brock BadHARPER, CHARLES A on: 10/28/2016 03:37 PM   Modules accepted: Orders

## 2016-10-30 LAB — HEMOGLOBINOPATHY EVALUATION
HEMOGLOBIN A2 QUANTITATION: 2.8 % (ref 0.7–3.1)
HGB C: 0 %
HGB S: 0 %
Hemoglobin F Quantitation: 0 % (ref 0.0–2.0)
Hgb A: 97.2 % (ref 94.0–98.0)

## 2016-10-30 LAB — PRENATAL PROFILE I(LABCORP)
Antibody Screen: NEGATIVE
BASOS ABS: 0 10*3/uL (ref 0.0–0.2)
Basos: 0 %
EOS (ABSOLUTE): 0.1 10*3/uL (ref 0.0–0.4)
Eos: 1 %
Hematocrit: 33.4 % — ABNORMAL LOW (ref 34.0–46.6)
Hemoglobin: 11.6 g/dL (ref 11.1–15.9)
Hepatitis B Surface Ag: NEGATIVE
Immature Grans (Abs): 0 10*3/uL (ref 0.0–0.1)
Immature Granulocytes: 0 %
LYMPHS ABS: 1.8 10*3/uL (ref 0.7–3.1)
Lymphs: 32 %
MCH: 29.8 pg (ref 26.6–33.0)
MCHC: 34.7 g/dL (ref 31.5–35.7)
MCV: 86 fL (ref 79–97)
Monocytes Absolute: 0.7 10*3/uL (ref 0.1–0.9)
Monocytes: 12 %
NEUTROS PCT: 55 %
Neutrophils Absolute: 3 10*3/uL (ref 1.4–7.0)
PLATELETS: 241 10*3/uL (ref 150–379)
RBC: 3.89 x10E6/uL (ref 3.77–5.28)
RDW: 13 % (ref 12.3–15.4)
RH TYPE: POSITIVE
RPR: NONREACTIVE
Rubella Antibodies, IGG: 12.7 index (ref >0.99)
WBC: 5.5 10*3/uL (ref 3.4–10.8)

## 2016-10-30 LAB — VARICELLA ZOSTER ANTIBODY, IGG: Varicella zoster IgG: <135 index — ABNORMAL LOW (ref >165)

## 2016-10-30 LAB — VITAMIN D 25 HYDROXY (VIT D DEFICIENCY, FRACTURES): VIT D 25 HYDROXY: 22.1 ng/mL — AB (ref 30.0–100.0)

## 2016-10-30 LAB — URINE CULTURE, OB REFLEX

## 2016-10-30 LAB — CULTURE, OB URINE

## 2016-10-30 LAB — HIV ANTIBODY (ROUTINE TESTING W REFLEX): HIV Screen 4th Generation wRfx: NONREACTIVE

## 2016-11-01 LAB — NUSWAB VG+, CANDIDA 6SP
CANDIDA KRUSEI, NAA: NEGATIVE
CANDIDA LUSITANIAE, NAA: NEGATIVE
CANDIDA PARAPSILOSIS, NAA: NEGATIVE
CANDIDA TROPICALIS, NAA: NEGATIVE
CHLAMYDIA TRACHOMATIS, NAA: NEGATIVE
Candida albicans, NAA: POSITIVE — AB
Candida glabrata, NAA: NEGATIVE
Neisseria gonorrhoeae, NAA: NEGATIVE
TRICH VAG BY NAA: NEGATIVE

## 2016-11-03 ENCOUNTER — Other Ambulatory Visit: Payer: Self-pay | Admitting: Obstetrics

## 2016-11-03 DIAGNOSIS — B3731 Acute candidiasis of vulva and vagina: Secondary | ICD-10-CM

## 2016-11-03 DIAGNOSIS — B373 Candidiasis of vulva and vagina: Secondary | ICD-10-CM

## 2016-11-03 LAB — CYTOLOGY - PAP: DIAGNOSIS: NEGATIVE

## 2016-11-03 MED ORDER — TERCONAZOLE 0.8 % VA CREA: 1.0000 | TOPICAL_CREAM | Freq: Every day | VAGINAL | 0 refills | Status: DC

## 2016-11-03 NOTE — Progress Notes (Signed)
Varicella non immune

## 2016-11-04 LAB — TOXASSURE SELECT 13 (MW), URINE

## 2016-11-06 ENCOUNTER — Ambulatory Visit (INDEPENDENT_AMBULATORY_CARE_PROVIDER_SITE_OTHER): Payer: Medicaid Other

## 2016-11-06 DIAGNOSIS — Z3492 Encounter for supervision of normal pregnancy, unspecified, second trimester: Secondary | ICD-10-CM

## 2016-11-11 ENCOUNTER — Ambulatory Visit (INDEPENDENT_AMBULATORY_CARE_PROVIDER_SITE_OTHER): Payer: Medicaid Other | Admitting: Obstetrics

## 2016-11-11 VITALS — BP 104/74 | HR 69 | Wt 180.6 lb

## 2016-11-11 DIAGNOSIS — Z3492 Encounter for supervision of normal pregnancy, unspecified, second trimester: Secondary | ICD-10-CM

## 2016-11-11 DIAGNOSIS — Z3402 Encounter for supervision of normal first pregnancy, second trimester: Secondary | ICD-10-CM

## 2016-11-12 ENCOUNTER — Encounter: Payer: Self-pay | Admitting: Obstetrics

## 2016-11-12 ENCOUNTER — Telehealth: Payer: Self-pay

## 2016-11-12 NOTE — Telephone Encounter (Signed)
Spoke with pt and advised of lab results and rx.

## 2016-11-12 NOTE — Progress Notes (Signed)
  Subjective:    Tina Mays is a 25 y.o. female being seen today for her obstetrical visit. She is at 3868w0d gestation. Patient reports: no complaints.  Problem List Items Addressed This Visit    None    Visit Diagnoses    Encounter for supervision of low-risk pregnancy in second trimester    -  Primary   Relevant Orders   AFP, Quad Screen   US OB Comp + 14 Wk     There are no active problems to display for this patient.   Objective:     BP 104/74   Pulse 69   Wt 180 lb 9.6 oz (81.9 kg)   LMP 03/08/2016   BMI 31.99 kg/m  Uterine Size: Below umbilicus     Assessment:    Pregnancy @ 1968w0d  weeks Doing well    Plan:    Problem list reviewed and updated. Labs reviewed.  Follow up in 4 weeks. FIRST/CF mutation testing/NIPT/QUAD SCREEN/fragile X/Ashkenazi Jewish population testing/Spinal muscular atrophy discussed: requested. Role of ultrasound in pregnancy discussed; fetal survey: requested. Amniocentesis discussed: not indicated. 50% of 15 minute visit spent on counseling and coordination of care.  [name], female   DOB: Jul 25, 1991, 25 y.o.   MRN: [id number]

## 2016-11-13 LAB — AFP, QUAD SCREEN
DIA Mom Value: 0.8
DIA VALUE (EIA): 130.94 pg/mL
DSR (By Age)    1 IN: 911
DSR (Second Trimester) 1 IN: 4390
GESTATIONAL AGE AFP: 15.9 wk
MSAFP Mom: 1.11
MSAFP: 34.9 ng/mL
MSHCG MOM: 1.99
MSHCG: 71892 m[IU]/mL
Maternal Age At EDD: 27.2 YEARS
Osb Risk: 10000
Test Results:: NEGATIVE
UE3 MOM: 0.72
UE3 VALUE: 0.54 ng/mL
Weight: 180 [lb_av]

## 2016-11-28 ENCOUNTER — Ambulatory Visit: Payer: Medicaid Other

## 2016-11-28 DIAGNOSIS — Z3492 Encounter for supervision of normal pregnancy, unspecified, second trimester: Secondary | ICD-10-CM

## 2016-12-02 ENCOUNTER — Encounter: Payer: Self-pay | Admitting: *Deleted

## 2016-12-08 NOTE — L&D Delivery Note (Signed)
27 y.o. [id number] at [redacted]w[redacted]d delivered a viable female infant in cephalic, LOA position. No nuchal cord. Right anterior shoulder delivered with ease. 60 sec delayed cord clamping. Cord clamped x2 and cut. Placenta delivered spontaneously intact, with 3VC. Fundus firm on exam with massage and pitocin. Good hemostasis noted.  Anesthesia: Epidural Laceration: None Suture: N/A Good hemostasis noted. EBL: 200 cc  Mom and baby recovering in LDR.  NNT was present for delivery, cleared baby for skin to skin.  Apgars: APGAR (1 MIN): 7   APGAR (5 MINS): 9    Weight: Pending skin to skin Placenta to pathology.   Jen Mow, DO OB Fellow [facility] [employer], [facility] 03/28/2017, 9:30 AM

## 2016-12-09 ENCOUNTER — Encounter: Payer: Medicaid Other | Admitting: Obstetrics

## 2016-12-17 ENCOUNTER — Ambulatory Visit (INDEPENDENT_AMBULATORY_CARE_PROVIDER_SITE_OTHER): Payer: Medicaid Other | Admitting: Obstetrics

## 2016-12-17 ENCOUNTER — Encounter: Payer: Self-pay | Admitting: Obstetrics

## 2016-12-17 VITALS — BP 105/68 | HR 75 | Wt 186.8 lb

## 2016-12-17 DIAGNOSIS — Z3480 Encounter for supervision of other normal pregnancy, unspecified trimester: Secondary | ICD-10-CM

## 2016-12-17 DIAGNOSIS — Z3482 Encounter for supervision of other normal pregnancy, second trimester: Secondary | ICD-10-CM

## 2016-12-17 NOTE — Progress Notes (Signed)
Subjective:    Tina Mays is a 26 y.o. female being seen today for her obstetrical visit. She is at 4760w0d gestation. Patient reports: no complaints . Fetal movement: normal.  Problem List Items Addressed This Visit    Encounter for supervision of normal pregnancy in multigravida, antepartum     Patient Active Problem List   Diagnosis Date Noted  . Encounter for supervision of normal pregnancy in multigravida, antepartum 08/05/2013   Objective:    BP 105/68   Pulse 75   Wt 186 lb 12.8 oz (84.7 kg)   LMP 03/08/2016   BMI 33.09 kg/m  FHT: 150 BPM  Uterine Size: size equals dates     Assessment:    Pregnancy @ 560w0d    Plan:    Signs and symptoms of preterm labor: discussed.  Labs, problem list reviewed and updated 2 hr GTT planned Follow up in 4 weeks.  [name], female   DOB: 28-Nov-1991, 26 y.o.   MRN: [id number]

## 2016-12-17 NOTE — Progress Notes (Signed)
Patient is having pain L abdomen. She also has some lower pelvic pain. Patient has questions about her vitamin D level.

## 2017-01-14 ENCOUNTER — Ambulatory Visit (INDEPENDENT_AMBULATORY_CARE_PROVIDER_SITE_OTHER): Payer: Medicaid Other | Admitting: Obstetrics & Gynecology

## 2017-01-14 DIAGNOSIS — Z3482 Encounter for supervision of other normal pregnancy, second trimester: Secondary | ICD-10-CM

## 2017-01-14 DIAGNOSIS — Z3480 Encounter for supervision of other normal pregnancy, unspecified trimester: Secondary | ICD-10-CM

## 2017-01-14 NOTE — Progress Notes (Signed)
   PRENATAL VISIT NOTE  Subjective:  Tina Mays is a 26 y.o. [id number] at 3896w0d being seen today for ongoing prenatal care.  She is currently monitored for the following issues for this low-risk pregnancy and has Encounter for supervision of normal pregnancy in multigravida, antepartum on her problem list.  Patient reports no complaints.  Contractions: Irritability. Vag. Bleeding: None.  Movement: Present. Denies leaking of fluid.   The following portions of the patient's history were reviewed and updated as appropriate: allergies, current medications, past family history, past medical history, past social history, past surgical history and problem list. Problem list updated.  Objective:   Vitals:   01/14/17 1325  BP: 100/65  Pulse: 75  Temp: 97.9 F (36.6 C)  Weight: 188 lb 9.6 oz (85.5 kg)    Fetal Status: Fetal Heart Rate (bpm): 140   Movement: Present     General:  Alert, oriented and cooperative. Patient is in no acute distress.  Skin: Skin is warm and dry. No rash noted.   Cardiovascular: Normal heart rate noted  Respiratory: Normal respiratory effort, no problems with respiration noted  Abdomen: Soft, gravid, appropriate for gestational age. Pain/Pressure: Present     Pelvic:  Cervical exam deferred        Extremities: Normal range of motion.  Edema: None  Mental Status: Normal mood and affect. Normal behavior. Normal judgment and thought content.   Assessment and Plan:  Pregnancy: [id number] at 2896w0d  1. Encounter for supervision of normal pregnancy in multigravida, antepartum Doing well  Preterm labor symptoms and general obstetric precautions including but not limited to vaginal bleeding, contractions, leaking of fluid and fetal movement were reviewed in detail with the patient. Please refer to After Visit Summary for other counseling recommendations.  Return in about 3 weeks (around 02/04/2017) for 2 hr gtt.   Adam PhenixJames G Krysti Hickling, MD

## 2017-02-04 ENCOUNTER — Ambulatory Visit (INDEPENDENT_AMBULATORY_CARE_PROVIDER_SITE_OTHER): Payer: Medicaid Other | Admitting: Obstetrics and Gynecology

## 2017-02-04 ENCOUNTER — Other Ambulatory Visit: Payer: Medicaid Other

## 2017-02-04 VITALS — BP 102/62 | HR 86 | Wt 192.0 lb

## 2017-02-04 DIAGNOSIS — Z3480 Encounter for supervision of other normal pregnancy, unspecified trimester: Secondary | ICD-10-CM

## 2017-02-04 DIAGNOSIS — Z23 Encounter for immunization: Secondary | ICD-10-CM | POA: Diagnosis not present

## 2017-02-04 DIAGNOSIS — Z3483 Encounter for supervision of other normal pregnancy, third trimester: Secondary | ICD-10-CM

## 2017-02-04 NOTE — Progress Notes (Signed)
28wk labs, tdap today

## 2017-02-04 NOTE — Addendum Note (Signed)
Addended by: Garret ReddishBARNES, Darra Rosa M on: 02/04/2017 09:41 AM   Modules accepted: Orders

## 2017-02-04 NOTE — Progress Notes (Signed)
   PRENATAL VISIT NOTE  Subjective:  Tina Mays is a 26 y.o. [id number] at 6446w0d being seen today for ongoing prenatal care.  She is currently monitored for the following issues for this low-risk pregnancy and has Encounter for supervision of normal pregnancy in multigravida, antepartum on her problem list.  Patient reports no complaints.  Contractions: Irritability. Vag. Bleeding: None.  Movement: Present. Denies leaking of fluid.   The following portions of the patient's history were reviewed and updated as appropriate: allergies, current medications, past family history, past medical history, past social history, past surgical history and problem list. Problem list updated.  Objective:   Vitals:   02/04/17 0850  BP: 102/62  Pulse: 86  Weight: 192 lb (87.1 kg)    Fetal Status:   Fundal Height: 30 cm Movement: Present     General:  Alert, oriented and cooperative. Patient is in no acute distress.  Skin: Skin is warm and dry. No rash noted.   Cardiovascular: Normal heart rate noted  Respiratory: Normal respiratory effort, no problems with respiration noted  Abdomen: Soft, gravid, appropriate for gestational age. Pain/Pressure: Present     Pelvic:  Cervical exam deferred        Extremities: Normal range of motion.  Edema: None  Mental Status: Normal mood and affect. Normal behavior. Normal judgment and thought content.   Assessment and Plan:  Pregnancy: [id number] at 2646w0d  1. Encounter for supervision of normal pregnancy in multigravida, antepartum Patient is doing well without complaints Third trimester labs with Tdap today - Glucose Tolerance, 2 Hours w/1 Hour - CBC - HIV antibody (with reflex) - RPR  Preterm labor symptoms and general obstetric precautions including but not limited to vaginal bleeding, contractions, leaking of fluid and fetal movement were reviewed in detail with the patient. Please refer to After Visit Summary for other counseling recommendations.  No  Follow-up on file.   Catalina AntiguaPeggy Endiya Klahr, MD

## 2017-02-05 LAB — CBC
Hematocrit: 29.8 % — ABNORMAL LOW (ref 34.0–46.6)
Hemoglobin: 10.5 g/dL — ABNORMAL LOW (ref 11.1–15.9)
MCH: 30.3 pg (ref 26.6–33.0)
MCHC: 35.2 g/dL (ref 31.5–35.7)
MCV: 86 fL (ref 79–97)
PLATELETS: 224 10*3/uL (ref 150–379)
RBC: 3.47 x10E6/uL — ABNORMAL LOW (ref 3.77–5.28)
RDW: 13.1 % (ref 12.3–15.4)
WBC: 6.9 10*3/uL (ref 3.4–10.8)

## 2017-02-05 LAB — GLUCOSE TOLERANCE, 2 HOURS W/ 1HR
GLUCOSE, 1 HOUR: 133 mg/dL (ref 65–179)
GLUCOSE, FASTING: 91 mg/dL (ref 65–91)
Glucose, 2 hour: 118 mg/dL (ref 65–152)

## 2017-02-05 LAB — RPR: RPR Ser Ql: NONREACTIVE

## 2017-02-05 LAB — HIV ANTIBODY (ROUTINE TESTING W REFLEX): HIV SCREEN 4TH GENERATION: NONREACTIVE

## 2017-02-18 ENCOUNTER — Ambulatory Visit (INDEPENDENT_AMBULATORY_CARE_PROVIDER_SITE_OTHER): Payer: Medicaid Other | Admitting: Obstetrics & Gynecology

## 2017-02-18 VITALS — BP 103/68 | HR 85 | Wt 197.0 lb

## 2017-02-18 DIAGNOSIS — Z3483 Encounter for supervision of other normal pregnancy, third trimester: Secondary | ICD-10-CM

## 2017-02-18 DIAGNOSIS — Z3480 Encounter for supervision of other normal pregnancy, unspecified trimester: Secondary | ICD-10-CM

## 2017-02-18 NOTE — Progress Notes (Signed)
   PRENATAL VISIT NOTE  Subjective:  Tina Mays is a 26 y.o. [id number] at 668w0d being seen today for ongoing prenatal care.  She is currently monitored for the following issues for this low-risk pregnancy and has Supervision of normal pregnancy, antepartum on her problem list.  Patient reports no complaints.  Contractions: Not present. Vag. Bleeding: None.  Movement: Present. Denies leaking of fluid.   The following portions of the patient's history were reviewed and updated as appropriate: allergies, current medications, past family history, past medical history, past social history, past surgical history and problem list. Problem list updated.  Objective:   Vitals:   02/18/17 1529  BP: 103/68  Pulse: 85  Weight: 197 lb (89.4 kg)    Fetal Status: Fetal Heart Rate (bpm): 130   Movement: Present     General:  Alert, oriented and cooperative. Patient is in no acute distress.  Skin: Skin is warm and dry. No rash noted.   Cardiovascular: Normal heart rate noted  Respiratory: Normal respiratory effort, no problems with respiration noted  Abdomen: Soft, gravid, appropriate for gestational age. Pain/Pressure: Absent     Pelvic:  Cervical exam deferred        Extremities: Normal range of motion.     Mental Status: Normal mood and affect. Normal behavior. Normal judgment and thought content.   Assessment and Plan:  Pregnancy: [id number] at 6368w0d  1. Supervision of other normal pregnancy, antepartum   Preterm labor symptoms and general obstetric precautions including but not limited to vaginal bleeding, contractions, leaking of fluid and fetal movement were reviewed in detail with the patient. Please refer to After Visit Summary for other counseling recommendations.  Return in about 2 weeks (around 03/04/2017).   Adam PhenixJames G Kelse Ploch, MD

## 2017-02-18 NOTE — Patient Instructions (Signed)
Third Trimester of Pregnancy The third trimester is from week 28 through week 40 (months 7 through 9). The third trimester is a time when the unborn baby (fetus) is growing rapidly. At the end of the ninth month, the fetus is about 20 inches in length and weighs 6-10 pounds. Body changes during your third trimester Your body will continue to go through many changes during pregnancy. The changes vary from woman to woman. During the third trimester:  Your weight will continue to increase. You can expect to gain 25-35 pounds (11-16 kg) by the end of the pregnancy.  You may begin to get stretch marks on your hips, abdomen, and breasts.  You may urinate more often because the fetus is moving lower into your pelvis and pressing on your bladder.  You may develop or continue to have heartburn. This is caused by increased hormones that slow down muscles in the digestive tract.  You may develop or continue to have constipation because increased hormones slow digestion and cause the muscles that push waste through your intestines to relax.  You may develop hemorrhoids. These are swollen veins (varicose veins) in the rectum that can itch or be painful.  You may develop swollen, bulging veins (varicose veins) in your legs.  You may have increased body aches in the pelvis, back, or thighs. This is due to weight gain and increased hormones that are relaxing your joints.  You may have changes in your hair. These can include thickening of your hair, rapid growth, and changes in texture. Some women also have hair loss during or after pregnancy, or hair that feels dry or thin. Your hair will most likely return to normal after your baby is born.  Your breasts will continue to grow and they will continue to become tender. A yellow fluid (colostrum) may leak from your breasts. This is the first milk you are producing for your baby.  Your belly button may stick out.  You may notice more swelling in your hands,  face, or ankles.  You may have increased tingling or numbness in your hands, arms, and legs. The skin on your belly may also feel numb.  You may feel short of breath because of your expanding uterus.  You may have more problems sleeping. This can be caused by the size of your belly, increased need to urinate, and an increase in your body's metabolism.  You may notice the fetus "dropping," or moving lower in your abdomen (lightening).  You may have increased vaginal discharge.  You may notice your joints feel loose and you may have pain around your pelvic bone.  What to expect at prenatal visits You will have prenatal exams every 2 weeks until week 36. Then you will have weekly prenatal exams. During a routine prenatal visit:  You will be weighed to make sure you and the baby are growing normally.  Your blood pressure will be taken.  Your abdomen will be measured to track your baby's growth.  The fetal heartbeat will be listened to.  Any test results from the previous visit will be discussed.  You may have a cervical check near your due date to see if your cervix has softened or thinned (effaced).  You will be tested for Group B streptococcus. This happens between 35 and 37 weeks.  Your health care provider may ask you:  What your birth plan is.  How you are feeling.  If you are feeling the baby move.  If you have had   any abnormal symptoms, such as leaking fluid, bleeding, severe headaches, or abdominal cramping.  If you are using any tobacco products, including cigarettes, chewing tobacco, and electronic cigarettes.  If you have any questions.  Other tests or screenings that may be performed during your third trimester include:  Blood tests that check for low iron levels (anemia).  Fetal testing to check the health, activity level, and growth of the fetus. Testing is done if you have certain medical conditions or if there are problems during the  pregnancy.  Nonstress test (NST). This test checks the health of your baby to make sure there are no signs of problems, such as the baby not getting enough oxygen. During this test, a belt is placed around your belly. The baby is made to move, and its heart rate is monitored during movement.  What is false labor? False labor is a condition in which you feel small, irregular tightenings of the muscles in the womb (contractions) that usually go away with rest, changing position, or drinking water. These are called Braxton Hicks contractions. Contractions may last for hours, days, or even weeks before true labor sets in. If contractions come at regular intervals, become more frequent, increase in intensity, or become painful, you should see your health care provider. What are the signs of labor?  Abdominal cramps.  Regular contractions that start at 10 minutes apart and become stronger and more frequent with time.  Contractions that start on the top of the uterus and spread down to the lower abdomen and back.  Increased pelvic pressure and dull back pain.  A watery or bloody mucus discharge that comes from the vagina.  Leaking of amniotic fluid. This is also known as your "water breaking." It could be a slow trickle or a gush. Let your health care provider know if it has a color or strange odor. If you have any of these signs, call your health care provider right away, even if it is before your due date. Follow these instructions at home: Medicines  Follow your health care provider's instructions regarding medicine use. Specific medicines may be either safe or unsafe to take during pregnancy.  Take a prenatal vitamin that contains at least 600 micrograms (mcg) of folic acid.  If you develop constipation, try taking a stool softener if your health care provider approves. Eating and drinking  Eat a balanced diet that includes fresh fruits and vegetables, whole grains, good sources of protein  such as meat, eggs, or tofu, and low-fat dairy. Your health care provider will help you determine the amount of weight gain that is right for you.  Avoid raw meat and uncooked cheese. These carry germs that can cause birth defects in the baby.  If you have low calcium intake from food, talk to your health care provider about whether you should take a daily calcium supplement.  Eat four or five small meals rather than three large meals a day.  Limit foods that are high in fat and processed sugars, such as fried and sweet foods.  To prevent constipation: ? Drink enough fluid to keep your urine clear or pale yellow. ? Eat foods that are high in fiber, such as fresh fruits and vegetables, whole grains, and beans. Activity  Exercise only as directed by your health care provider. Most women can continue their usual exercise routine during pregnancy. Try to exercise for 30 minutes at least 5 days a week. Stop exercising if you experience uterine contractions.  Avoid heavy   lifting.  Do not exercise in extreme heat or humidity, or at high altitudes.  Wear low-heel, comfortable shoes.  Practice good posture.  You may continue to have sex unless your health care provider tells you otherwise. Relieving pain and discomfort  Take frequent breaks and rest with your legs elevated if you have leg cramps or low back pain.  Take warm sitz baths to soothe any pain or discomfort caused by hemorrhoids. Use hemorrhoid cream if your health care provider approves.  Wear a good support bra to prevent discomfort from breast tenderness.  If you develop varicose veins: ? Wear support pantyhose or compression stockings as told by your healthcare provider. ? Elevate your feet for 15 minutes, 3-4 times a day. Prenatal care  Write down your questions. Take them to your prenatal visits.  Keep all your prenatal visits as told by your health care provider. This is important. Safety  Wear your seat belt at  all times when driving.  Make a list of emergency phone numbers, including numbers for family, friends, the hospital, and police and fire departments. General instructions  Avoid cat litter boxes and soil used by cats. These carry germs that can cause birth defects in the baby. If you have a cat, ask someone to clean the litter box for you.  Do not travel far distances unless it is absolutely necessary and only with the approval of your health care provider.  Do not use hot tubs, steam rooms, or saunas.  Do not drink alcohol.  Do not use any products that contain nicotine or tobacco, such as cigarettes and e-cigarettes. If you need help quitting, ask your health care provider.  Do not use any medicinal herbs or unprescribed drugs. These chemicals affect the formation and growth of the baby.  Do not douche or use tampons or scented sanitary pads.  Do not cross your legs for long periods of time.  To prepare for the arrival of your baby: ? Take prenatal classes to understand, practice, and ask questions about labor and delivery. ? Make a trial run to the hospital. ? Visit the hospital and tour the maternity area. ? Arrange for maternity or paternity leave through employers. ? Arrange for family and friends to take care of pets while you are in the hospital. ? Purchase a rear-facing car seat and make sure you know how to install it in your car. ? Pack your hospital bag. ? Prepare the baby's nursery. Make sure to remove all pillows and stuffed animals from the baby's crib to prevent suffocation.  Visit your dentist if you have not gone during your pregnancy. Use a soft toothbrush to brush your teeth and be gentle when you floss. Contact a health care provider if:  You are unsure if you are in labor or if your water has broken.  You become dizzy.  You have mild pelvic cramps, pelvic pressure, or nagging pain in your abdominal area.  You have lower back pain.  You have persistent  nausea, vomiting, or diarrhea.  You have an unusual or bad smelling vaginal discharge.  You have pain when you urinate. Get help right away if:  Your water breaks before 37 weeks.  You have regular contractions less than 5 minutes apart before 37 weeks.  You have a fever.  You are leaking fluid from your vagina.  You have spotting or bleeding from your vagina.  You have severe abdominal pain or cramping.  You have rapid weight loss or weight gain.    You have shortness of breath with chest pain.  You notice sudden or extreme swelling of your face, hands, ankles, feet, or legs.  Your baby makes fewer than 10 movements in 2 hours.  You have severe headaches that do not go away when you take medicine.  You have vision changes. Summary  The third trimester is from week 28 through week 40, months 7 through 9. The third trimester is a time when the unborn baby (fetus) is growing rapidly.  During the third trimester, your discomfort may increase as you and your baby continue to gain weight. You may have abdominal, leg, and back pain, sleeping problems, and an increased need to urinate.  During the third trimester your breasts will keep growing and they will continue to become tender. A yellow fluid (colostrum) may leak from your breasts. This is the first milk you are producing for your baby.  False labor is a condition in which you feel small, irregular tightenings of the muscles in the womb (contractions) that eventually go away. These are called Braxton Hicks contractions. Contractions may last for hours, days, or even weeks before true labor sets in.  Signs of labor can include: abdominal cramps; regular contractions that start at 10 minutes apart and become stronger and more frequent with time; watery or bloody mucus discharge that comes from the vagina; increased pelvic pressure and dull back pain; and leaking of amniotic fluid. This information is not intended to replace advice  given to you by your health care provider. Make sure you discuss any questions you have with your health care provider. Document Released: 11/18/2001 Document Revised: 05/01/2016 Document Reviewed: 01/25/2013 Elsevier Interactive Patient Education  2017 [employer]  

## 2017-03-05 ENCOUNTER — Ambulatory Visit (INDEPENDENT_AMBULATORY_CARE_PROVIDER_SITE_OTHER): Payer: Medicaid Other | Admitting: Obstetrics & Gynecology

## 2017-03-05 VITALS — BP 118/76 | HR 101 | Wt 200.0 lb

## 2017-03-05 DIAGNOSIS — Z3480 Encounter for supervision of other normal pregnancy, unspecified trimester: Secondary | ICD-10-CM

## 2017-03-05 DIAGNOSIS — Z3483 Encounter for supervision of other normal pregnancy, third trimester: Secondary | ICD-10-CM

## 2017-03-05 NOTE — Patient Instructions (Signed)

## 2017-03-05 NOTE — Progress Notes (Signed)
   PRENATAL VISIT NOTE  Subjective:  Tina Mays is a 26 y.o. [id number] at 5438w1d being seen today for ongoing prenatal care.  She is currently monitored for the following issues for this low-risk pregnancy and has Supervision of normal pregnancy, antepartum on her problem list.  Patient reports no complaints.  Contractions: Not present. Vag. Bleeding: None.  Movement: Present. Denies leaking of fluid.   The following portions of the patient's history were reviewed and updated as appropriate: allergies, current medications, past family history, past medical history, past social history, past surgical history and problem list. Problem list updated.  Objective:   Vitals:   03/05/17 1604  BP: 118/76  Pulse: (!) 101  Weight: 200 lb (90.7 kg)    Fetal Status: Fetal Heart Rate (bpm): 135 Fundal Height: 32 cm Movement: Present     General:  Alert, oriented and cooperative. Patient is in no acute distress.  Skin: Skin is warm and dry. No rash noted.   Cardiovascular: Normal heart rate noted  Respiratory: Normal respiratory effort, no problems with respiration noted  Abdomen: Soft, gravid, appropriate for gestational age. Pain/Pressure: Present     Pelvic:  Cervical exam deferred        Extremities: Normal range of motion.  Edema: None  Mental Status: Normal mood and affect. Normal behavior. Normal judgment and thought content.   Assessment and Plan:  Pregnancy: [id number] at 6938w1d  1. Supervision of other normal pregnancy, antepartum LLQ MS pain  Preterm labor symptoms and general obstetric precautions including but not limited to vaginal bleeding, contractions, leaking of fluid and fetal movement were reviewed in detail with the patient. Please refer to After Visit Summary for other counseling recommendations.  Return in about 2 weeks (around 03/19/2017).   Adam PhenixJames G Xareni Kelch, MD

## 2017-03-15 ENCOUNTER — Inpatient Hospital Stay (HOSPITAL_COMMUNITY)
Admission: AD | Admit: 2017-03-15 | Discharge: 2017-03-15 | Disposition: A | Discharge status: Home / Self Care | Payer: Medicaid Other | Source: Ambulatory Visit | Attending: Obstetrics & Gynecology | Admitting: Obstetrics & Gynecology

## 2017-03-15 ENCOUNTER — Encounter (HOSPITAL_COMMUNITY): Payer: Self-pay

## 2017-03-15 DIAGNOSIS — Z3A33 33 weeks gestation of pregnancy: Secondary | ICD-10-CM | POA: Insufficient documentation

## 2017-03-15 DIAGNOSIS — O4693 Antepartum hemorrhage, unspecified, third trimester: Secondary | ICD-10-CM | POA: Diagnosis present

## 2017-03-15 DIAGNOSIS — O1493 Unspecified pre-eclampsia, third trimester: Secondary | ICD-10-CM | POA: Insufficient documentation

## 2017-03-15 LAB — URINALYSIS, ROUTINE W REFLEX MICROSCOPIC
BILIRUBIN URINE: NEGATIVE
Glucose, UA: NEGATIVE mg/dL
HGB URINE DIPSTICK: NEGATIVE
KETONES UR: NEGATIVE mg/dL
Leukocytes, UA: NEGATIVE
NITRITE: NEGATIVE
PH: 8 (ref 5.0–8.0)
Protein, ur: NEGATIVE mg/dL
Specific Gravity, Urine: 1.009 (ref 1.005–1.030)

## 2017-03-15 MED ORDER — TERBUTALINE SULFATE 1 MG/ML IJ SOLN
0.2500 mg | Freq: Once | INTRAMUSCULAR | Status: AC
  Administered 2017-03-15: 0.25 mg via SUBCUTANEOUS
  Filled 2017-03-15: qty 1

## 2017-03-15 NOTE — Discharge Instructions (Signed)
Continue pelvic rest Be seen in the office if you have any more bleeding. Return here if you have severe bleeding or sudden worse abdominal pain.

## 2017-03-15 NOTE — MAU Note (Signed)
Pt states she felt pelvic & rectal pressure this morning, had 2 BM's.  Noted some ? Blood in toilet with BM.  Having urinary frequency, noted bleeding on toilet tissue.  Was advised by MD office to come in.  Denies contractions or LOF.

## 2017-03-15 NOTE — MAU Provider Note (Signed)
History     CSN: [id number]  Arrival date and time: 03/15/17 1742   First Provider Initiated Contact with Patient 03/15/17 1814      Chief Complaint  Patient presents with  . Vaginal Bleeding   HPI Tina Mays 26 y.o. [redacted]w[redacted]d  Comes to MAU with an episode of vaginal bleeding today.  Noticed red blood when wiping after going to the bathroom.  Wiped several times and still had blood so came for evaluation.  Denies any sex today.  Is not having contractions that she is aware of.  Denies any leaking of fluid.  OB History    Gravida Para Term Preterm AB Living   SAB TAB Ectopic Multiple Live Births   1       2      Past Medical History:  Diagnosis Date  . Pre-eclampsia     Past Surgical History:  Procedure Laterality Date  . WISDOM TOOTH EXTRACTION      Family History  Problem Relation Age of Onset  . Diabetes Mother   . Diabetes Father   . Hypertension Father   . Stroke Father   . Heart disease Father     Social History  Substance Use Topics  . Smoking status: Never Smoker  . Smokeless tobacco: Never Used  . Alcohol use 0.0 oz/week    Allergies: No Known Allergies  Prescriptions Prior to Admission  Medication Sig Dispense Refill Last Dose  . Prenat-Fe Poly-Methfol-FA-DHA (VITAFOL ULTRA) 29-0.6-0.4-200 MG CAPS Take 1 capsule by mouth daily. 30 capsule 11 Taking    Review of Systems  Constitutional: Negative for fever.  Gastrointestinal: Negative for abdominal pain, nausea and vomiting.  Genitourinary: Positive for vaginal bleeding. Negative for vaginal discharge.       No leaking of fluid   Physical Exam   Blood pressure 126/68, pulse 78, temperature 98.3 F (36.8 C), temperature source Oral, resp. rate 18, last menstrual period 03/08/2016.  Physical Exam  Nursing note and vitals reviewed. Constitutional: She is oriented to person, place, and time. She appears well-developed and well-nourished.  HENT:  Head: Normocephalic.  Eyes:  EOM are normal.  Neck: Neck supple.  GI: Soft. There is no tenderness.  Contractions 4-8 minutes, FHT baseline 135 with moderate variability and 15x15 accels noted.  Genitourinary:  Genitourinary Comments: Speculum exam Very small amount of red blood in vagina - no active bleeding Cervix 2-3 cm, thick, posterior, baby's head is down in front of cervix  Musculoskeletal: Normal range of motion.  Neurological: She is alert and oriented to person, place, and time.  Skin: Skin is warm and dry.  Psychiatric: She has a normal mood and affect.    MAU Course  Procedures Results for orders placed or performed during the hospital encounter of 03/15/17 (from the past 24 hour(s))  Urinalysis, Routine w reflex microscopic     Status: Abnormal   Collection Time: 03/15/17  6:00 PM  Result Value Ref Range   Color, Urine YELLOW YELLOW   APPearance HAZY (A) CLEAR   Specific Gravity, Urine 1.009 1.005 - 1.030   pH 8.0 5.0 - 8.0   Glucose, UA NEGATIVE NEGATIVE mg/dL   Hgb urine dipstick NEGATIVE NEGATIVE   Bilirubin Urine NEGATIVE NEGATIVE   Ketones, ur NEGATIVE NEGATIVE mg/dL   Protein, ur NEGATIVE NEGATIVE mg/dL   Nitrite NEGATIVE NEGATIVE   Leukocytes, UA NEGATIVE NEGATIVE    MDM Consult with Dr. Despina Hidden by phone and  discussed plan of care.  Will give Terbutaline SQ and push PO fluids to help uterus be quiet.  Most likely blood is coming from cervical changes. Terbutaline given at 7 pm 8pm - no contractions in last 20 minutes.  Has had a large container of water.  No further bleeding.  Will send home.  Assessment and Plan  Vaginal bleeding in pregnancy, third trimester - currently resolved  Plan Continue pelvic rest Be seen in the office if you have any more bleeding. Return here if you have severe bleeding or sudden worse abdominal pain.   Currie Paris 03/15/2017, 6:57 PM

## 2017-03-17 ENCOUNTER — Inpatient Hospital Stay (HOSPITAL_COMMUNITY)
Admission: AD | Admit: 2017-03-17 | Discharge: 2017-03-17 | Disposition: A | Discharge status: Home / Self Care | Payer: Medicaid Other | Source: Ambulatory Visit | Attending: Family Medicine | Admitting: Family Medicine

## 2017-03-17 ENCOUNTER — Encounter (HOSPITAL_COMMUNITY): Payer: Self-pay | Admitting: *Deleted

## 2017-03-17 DIAGNOSIS — Z3A33 33 weeks gestation of pregnancy: Secondary | ICD-10-CM | POA: Insufficient documentation

## 2017-03-17 DIAGNOSIS — O133 Gestational [pregnancy-induced] hypertension without significant proteinuria, third trimester: Secondary | ICD-10-CM | POA: Diagnosis not present

## 2017-03-17 DIAGNOSIS — O3483 Maternal care for other abnormalities of pelvic organs, third trimester: Secondary | ICD-10-CM | POA: Insufficient documentation

## 2017-03-17 DIAGNOSIS — O3443 Maternal care for other abnormalities of cervix, third trimester: Secondary | ICD-10-CM | POA: Diagnosis not present

## 2017-03-17 DIAGNOSIS — O9989 Other specified diseases and conditions complicating pregnancy, childbirth and the puerperium: Secondary | ICD-10-CM | POA: Insufficient documentation

## 2017-03-17 DIAGNOSIS — A5611 Chlamydial female pelvic inflammatory disease: Secondary | ICD-10-CM | POA: Insufficient documentation

## 2017-03-17 DIAGNOSIS — O98313 Other infections with a predominantly sexual mode of transmission complicating pregnancy, third trimester: Secondary | ICD-10-CM | POA: Insufficient documentation

## 2017-03-17 DIAGNOSIS — Z331 Pregnant state, incidental: Secondary | ICD-10-CM

## 2017-03-17 DIAGNOSIS — N888 Other specified noninflammatory disorders of cervix uteri: Secondary | ICD-10-CM | POA: Insufficient documentation

## 2017-03-17 DIAGNOSIS — Z3480 Encounter for supervision of other normal pregnancy, unspecified trimester: Secondary | ICD-10-CM

## 2017-03-17 HISTORY — DX: Gestational (pregnancy-induced) hypertension without significant proteinuria, unspecified trimester: O13.9

## 2017-03-17 HISTORY — DX: Chlamydial infection, unspecified: A74.9

## 2017-03-17 HISTORY — DX: Unspecified ovarian cyst, unspecified side: N83.209

## 2017-03-17 HISTORY — DX: Unspecified infectious disease: B99.9

## 2017-03-17 LAB — URINALYSIS, ROUTINE W REFLEX MICROSCOPIC
BILIRUBIN URINE: NEGATIVE
Glucose, UA: NEGATIVE mg/dL
Hgb urine dipstick: NEGATIVE
KETONES UR: NEGATIVE mg/dL
Leukocytes, UA: NEGATIVE
NITRITE: NEGATIVE
Protein, ur: NEGATIVE mg/dL
Specific Gravity, Urine: 1.017 (ref 1.005–1.030)
pH: 7 (ref 5.0–8.0)

## 2017-03-17 NOTE — MAU Note (Signed)
Has been trying to poop today. Finally did, but felt like a bulge or 'bubbel" when she did.  Not out as much now.

## 2017-03-17 NOTE — Discharge Instructions (Signed)

## 2017-03-17 NOTE — MAU Provider Note (Signed)
Chief Complaint:  bulge in vagina   First Provider Initiated Contact with Patient 03/17/17 1339     HPI: Tina Mays is a 26 y.o. [id number] at [redacted]w[redacted]d who presents to maternity admissions reporting seeing something bulging from her vagina after having a BM today. Mass regressed soon afterward.  Associated signs and symptoms: Neg for abd pain, VB, LOF, constipation, rectal bleeding,   Good fetal movement.   Pregnancy Course: Uncomplicated.   Was seen in MAU 03/15/17 for vaginal bleeding. Cervix was 2-3 cm/long. No mention of anything abnml on pelvic exam.   Past Medical History:  Diagnosis Date  . Chlamydia   . Infection    UTI  . Ovarian cyst   . Pre-eclampsia   . Pregnancy induced hypertension    with first preg   OB History  Gravida Para Term Preterm AB Living  SAB TAB Ectopic Multiple Live Births  1 0     2    # Outcome Date GA Lbr Len/2nd Weight Sex Delivery Anes PTL Lv  5 Current           4 Term 11/14/13 [redacted]w[redacted]d 12:27 / 00:15 6 lb 12.5 oz (3.075 kg) M Vag-Spont EPI  LIV  3 AB 06/07/09 [redacted]w[redacted]d         2 Term 12/08/08 [redacted]w[redacted]d  6 lb 8 oz (2.948 kg) F Vag-Spont EPI  LIV     Birth Comments: Pre-eclampsia.  Induced around 38 weeks.  1 SAB 05/09/07 [redacted]w[redacted]d            Past Surgical History:  Procedure Laterality Date  . NO PAST SURGERIES    . WISDOM TOOTH EXTRACTION     Family History  Problem Relation Age of Onset  . Hypertension Mother   . Diabetes Father   . Hypertension Father   . Stroke Father   . Heart disease Father   . Kidney disease Father    Social History  Substance Use Topics  . Smoking status: Never Smoker  . Smokeless tobacco: Never Used  . Alcohol use 0.0 oz/week   No Known Allergies Prescriptions Prior to Admission  Medication Sig Dispense Refill Last Dose  . calcium carbonate (TUMS - DOSED IN MG ELEMENTAL CALCIUM) 500 MG chewable tablet Chew 2 tablets by mouth at bedtime as needed for indigestion or heartburn.   Past Week at Unknown time   . Prenat-Fe Poly-Methfol-FA-DHA (VITAFOL ULTRA) 29-0.6-0.4-200 MG CAPS Take 1 capsule by mouth daily. 30 capsule 11 03/16/2017 at Unknown time    I have reviewed patient's Past Medical Hx, Surgical Hx, Family Hx, Social Hx, medications and allergies.   ROS:  Review of Systems  Constitutional: Negative for chills and fever.  Gastrointestinal: Negative for abdominal pain, constipation and rectal pain.  Genitourinary: Negative for difficulty urinating, pelvic pain, vaginal bleeding, vaginal discharge and vaginal pain.    Physical Exam  Patient Vitals for the past 24 hrs:  BP Temp Temp src Pulse Resp  03/17/17 1304 111/68 98.5 F (36.9 C) Oral 95 16   Constitutional: Well-developed, well-nourished female in no acute distress.  Cardiovascular: normal rate Respiratory: normal effort GI: Abd soft, non-tender, gravid appropriate for gestational age.  MS: Extremities nontender, no edema, normal ROM Neurologic: Alert and oriented x 4.  GU: NEFG, no cystocele or rectocele w/ valsalva.  Speculum exam: Large ~5 cm compressable, pink, TN fluctuant mass filling the speculum. Physiologic discharge, no blood. Cervix incompletely visualized. Bimanual exam: Cystic mass  feels as if it is attached to anterior lip of cervix. Clearly distinct from BOW.    Dilation: 2.5 Effacement (%): Thick Cervical Position: Posterior Station: -2 Exam by:: Bandon Sherwin,cnm  FHT:  Baseline 130 , moderate variability, accelerations present, no decelerations Contractions: None   Labs: Results for orders placed or performed during the hospital encounter of 03/17/17 (from the past 24 hour(s))  Urinalysis, Routine w reflex microscopic     Status: Abnormal   Collection Time: 03/17/17  1:00 PM  Result Value Ref Range   Color, Urine YELLOW YELLOW   APPearance CLOUDY (A) CLEAR   Specific Gravity, Urine 1.017 1.005 - 1.030   pH 7.0 5.0 - 8.0   Glucose, UA NEGATIVE NEGATIVE mg/dL   Hgb urine dipstick NEGATIVE NEGATIVE    Bilirubin Urine NEGATIVE NEGATIVE   Ketones, ur NEGATIVE NEGATIVE mg/dL   Protein, ur NEGATIVE NEGATIVE mg/dL   Nitrite NEGATIVE NEGATIVE   Leukocytes, UA NEGATIVE NEGATIVE    Imaging:  No results found.  MAU Course: Orders Placed This Encounter  Procedures  . Urinalysis, Routine w reflex microscopic  . Discharge patient   Meds ordered this encounter  Medications  . calcium carbonate (TUMS - DOSED IN MG ELEMENTAL CALCIUM) 500 MG chewable tablet    Sig: Chew 2 tablets by mouth at bedtime as needed for indigestion or heartburn.   MDM: - Asked Dr. Adrian Blackwater to evaluate. He and Dr. Emelda Fear repeated exam and agree that it appears to be a cervical cyst. No emergent condition evident. Will likely pop w/ delivery or can be drained afterward.  - Cervical dilation unchanged since 2 days ago. No contractions reported, traced or palpated.   Assessment: 1. Cervical cyst   2. Pregnancy, incidental   3. Supervision of other normal pregnancy, antepartum     Plan: Discharge home in stable condition.  Preterm Labor precautions and fetal kick counts Follow-up Information    [facility] Follow up on 03/18/2017.   Specialty:  Obstetrics and Gynecology Why:  as scheduled  Contact information: [address], [address] [place] [address] [phone]       [facility] [place] [facility] ADMISSIONS Follow up.   Why:  in emergencies Contact information: [address] [id number] mc [place] [address] [phone]          Allergies as of 03/17/2017   No Known Allergies     Medication List    TAKE these medications   calcium carbonate 500 MG chewable tablet Commonly known as:  TUMS - dosed in mg elemental calcium Chew 2 tablets by mouth at bedtime as needed for indigestion or heartburn.   VITAFOL ULTRA 29-0.6-0.4-200 MG Caps Take 1 capsule by mouth daily.       [place], [place] 03/17/2017 2:31 PM

## 2017-03-18 ENCOUNTER — Encounter: Payer: Self-pay | Admitting: Obstetrics

## 2017-03-18 ENCOUNTER — Ambulatory Visit (INDEPENDENT_AMBULATORY_CARE_PROVIDER_SITE_OTHER): Payer: Medicaid Other | Admitting: Obstetrics

## 2017-03-18 VITALS — BP 123/74 | HR 94 | Wt 204.0 lb

## 2017-03-18 DIAGNOSIS — Z3483 Encounter for supervision of other normal pregnancy, third trimester: Secondary | ICD-10-CM

## 2017-03-18 DIAGNOSIS — J301 Allergic rhinitis due to pollen: Secondary | ICD-10-CM | POA: Insufficient documentation

## 2017-03-18 DIAGNOSIS — Z3480 Encounter for supervision of other normal pregnancy, unspecified trimester: Secondary | ICD-10-CM

## 2017-03-18 MED ORDER — LORATADINE 10 MG PO TABS: 10.0000 mg | ORAL_TABLET | Freq: Every day | ORAL | 11 refills | Status: DC

## 2017-03-18 NOTE — Progress Notes (Signed)
Subjective:  Tina Mays is a 26 y.o. [id number] at [redacted]w[redacted]d being seen today for ongoing prenatal care.  She is currently monitored for the following issues for this low-risk pregnancy and has Supervision of normal pregnancy, antepartum and Acute seasonal allergic rhinitis due to pollen on her problem list.  Patient reports runny nose and congestion.  Contractions: Irritability. Vag. Bleeding: None.  Movement: Present. Denies leaking of fluid.   The following portions of the patient's history were reviewed and updated as appropriate: allergies, current medications, past family history, past medical history, past social history, past surgical history and problem list. Problem list updated.  Objective:   Vitals:   03/18/17 1009  BP: 123/74  Pulse: 94  Weight: 204 lb (92.5 kg)    Fetal Status: Fetal Heart Rate (bpm): 140   Movement: Present     General:  Alert, oriented and cooperative. Patient is in no acute distress.  Skin: Skin is warm and dry. No rash noted.   Cardiovascular: Normal heart rate noted  Respiratory: Normal respiratory effort, no problems with respiration noted  Abdomen: Soft, gravid, appropriate for gestational age. Pain/Pressure: Present     Pelvic:  Cervical exam deferred        Extremities: Normal range of motion.  Edema: None  Mental Status: Normal mood and affect. Normal behavior. Normal judgment and thought content.   Urinalysis:      Assessment and Plan:  1. Pregnancy: [id number] at [redacted]w[redacted]d  2. Allergic rhinitis Rx: - Claritin Rx  There are no diagnoses linked to this encounter. Preterm labor symptoms and general obstetric precautions including but not limited to vaginal bleeding, contractions, leaking of fluid and fetal movement were reviewed in detail with the patient. Please refer to After Visit Summary for other counseling recommendations.  Return in 1 week (on 03/25/2017).   Brock Bad, MDPatient ID: Tina Mays, female   DOB: [birth year], 26  y.o.   MRN: [id number]

## 2017-03-18 NOTE — Patient Instructions (Addendum)

## 2017-03-23 ENCOUNTER — Encounter: Payer: Self-pay | Admitting: Certified Nurse Midwife

## 2017-03-23 ENCOUNTER — Ambulatory Visit (INDEPENDENT_AMBULATORY_CARE_PROVIDER_SITE_OTHER): Payer: Medicaid Other | Admitting: Certified Nurse Midwife

## 2017-03-23 ENCOUNTER — Other Ambulatory Visit (HOSPITAL_COMMUNITY)
Admission: RE | Admit: 2017-03-23 | Discharge: 2017-03-23 | Disposition: A | Discharge status: Home / Self Care | Payer: Medicaid Other | Source: Ambulatory Visit | Attending: Certified Nurse Midwife | Admitting: Certified Nurse Midwife

## 2017-03-23 VITALS — BP 125/79 | HR 107 | Wt 201.0 lb

## 2017-03-23 DIAGNOSIS — B373 Candidiasis of vulva and vagina: Secondary | ICD-10-CM | POA: Insufficient documentation

## 2017-03-23 DIAGNOSIS — Z3483 Encounter for supervision of other normal pregnancy, third trimester: Secondary | ICD-10-CM

## 2017-03-23 DIAGNOSIS — O98819 Other maternal infectious and parasitic diseases complicating pregnancy, unspecified trimester: Secondary | ICD-10-CM | POA: Insufficient documentation

## 2017-03-23 DIAGNOSIS — Z3480 Encounter for supervision of other normal pregnancy, unspecified trimester: Secondary | ICD-10-CM

## 2017-03-23 DIAGNOSIS — Z3A00 Weeks of gestation of pregnancy not specified: Secondary | ICD-10-CM | POA: Insufficient documentation

## 2017-03-23 LAB — OB RESULTS CONSOLE GBS: STREP GROUP B AG: NEGATIVE

## 2017-03-23 NOTE — Progress Notes (Signed)
   PRENATAL VISIT NOTE  Subjective:  Tina Mays is a 26 y.o. [id number] at [redacted]w[redacted]d being seen today for ongoing prenatal care.  She is currently monitored for the following issues for this low-risk pregnancy and has Supervision of normal pregnancy, antepartum and Acute seasonal allergic rhinitis due to pollen on her problem list.  Patient reports no bleeding, no leaking and occasional contractions.  Contractions: Not present. Vag. Bleeding: None.  Movement: Present. Denies leaking of fluid.   The following portions of the patient's history were reviewed and updated as appropriate: allergies, current medications, past family history, past medical history, past social history, past surgical history and problem list. Problem list updated.  Objective:   Vitals:   03/23/17 1035  BP: 125/79  Pulse: (!) 107  Weight: 201 lb (91.2 kg)    Fetal Status: Fetal Heart Rate (bpm): 134 Fundal Height: 35 cm Movement: Present  Presentation: Vertex  General:  Alert, oriented and cooperative. Patient is in no acute distress.  Skin: Skin is warm and dry. No rash noted.   Cardiovascular: Normal heart rate noted  Respiratory: Normal respiratory effort, no problems with respiration noted  Abdomen: Soft, gravid, appropriate for gestational age. Pain/Pressure: Present     Pelvic:  Cervical exam performed Dilation: 2 Effacement (%): 50 Station: -3  Extremities: Normal range of motion.  Edema: None  Mental Status: Normal mood and affect. Normal behavior. Normal judgment and thought content.   Assessment and Plan:  Pregnancy: [id number] at [redacted]w[redacted]d  1. Supervision of other normal pregnancy, antepartum      Doing well, states that she lost her mucous plug.  Had some preterm contractions that were discontinued with procardia on 03/17/17.  Did not received BMZ injections.   - Culture, beta strep (group b only) - Cervicovaginal ancillary only  Preterm labor symptoms and general obstetric precautions including but  not limited to vaginal bleeding, contractions, leaking of fluid and fetal movement were reviewed in detail with the patient. Please refer to After Visit Summary for other counseling recommendations.  Return in about 1 week (around 03/30/2017) for ROB.   Roe Coombs, CNM

## 2017-03-24 LAB — CERVICOVAGINAL ANCILLARY ONLY
BACTERIAL VAGINITIS: NEGATIVE
CANDIDA VAGINITIS: POSITIVE — AB
Chlamydia: NEGATIVE
NEISSERIA GONORRHEA: NEGATIVE
Trichomonas: NEGATIVE

## 2017-03-25 ENCOUNTER — Other Ambulatory Visit: Payer: Self-pay | Admitting: Certified Nurse Midwife

## 2017-03-25 DIAGNOSIS — B373 Candidiasis of vulva and vagina: Secondary | ICD-10-CM

## 2017-03-25 DIAGNOSIS — B3731 Acute candidiasis of vulva and vagina: Secondary | ICD-10-CM

## 2017-03-25 MED ORDER — FLUCONAZOLE 150 MG PO TABS: 150.0000 mg | ORAL_TABLET | Freq: Once | ORAL | 0 refills | Status: AC

## 2017-03-27 ENCOUNTER — Inpatient Hospital Stay (HOSPITAL_COMMUNITY): Payer: Medicaid Other | Admitting: Anesthesiology

## 2017-03-27 ENCOUNTER — Inpatient Hospital Stay (HOSPITAL_COMMUNITY)
Admission: AD | Admit: 2017-03-27 | Discharge: 2017-03-30 | DRG: 775 | Disposition: A | Discharge status: Home / Self Care | Payer: Medicaid Other | Source: Ambulatory Visit | Attending: Family Medicine | Admitting: Family Medicine

## 2017-03-27 ENCOUNTER — Encounter (HOSPITAL_COMMUNITY): Payer: Self-pay | Admitting: *Deleted

## 2017-03-27 DIAGNOSIS — D649 Anemia, unspecified: Secondary | ICD-10-CM | POA: Diagnosis not present

## 2017-03-27 DIAGNOSIS — E669 Obesity, unspecified: Secondary | ICD-10-CM | POA: Diagnosis present

## 2017-03-27 DIAGNOSIS — O42913 Preterm premature rupture of membranes, unspecified as to length of time between rupture and onset of labor, third trimester: Principal | ICD-10-CM | POA: Diagnosis present

## 2017-03-27 DIAGNOSIS — O9081 Anemia of the puerperium: Secondary | ICD-10-CM | POA: Diagnosis not present

## 2017-03-27 DIAGNOSIS — O99214 Obesity complicating childbirth: Secondary | ICD-10-CM | POA: Diagnosis present

## 2017-03-27 DIAGNOSIS — O9962 Diseases of the digestive system complicating childbirth: Secondary | ICD-10-CM | POA: Diagnosis present

## 2017-03-27 DIAGNOSIS — K219 Gastro-esophageal reflux disease without esophagitis: Secondary | ICD-10-CM | POA: Diagnosis present

## 2017-03-27 DIAGNOSIS — Z3A35 35 weeks gestation of pregnancy: Secondary | ICD-10-CM

## 2017-03-27 DIAGNOSIS — Z6835 Body mass index (BMI) 35.0-35.9, adult: Secondary | ICD-10-CM

## 2017-03-27 LAB — CBC
HCT: 28.2 % — ABNORMAL LOW (ref 36.0–46.0)
Hemoglobin: 9.5 g/dL — ABNORMAL LOW (ref 12.0–15.0)
MCH: 29.1 pg (ref 26.0–34.0)
MCHC: 33.7 g/dL (ref 30.0–36.0)
MCV: 86.5 fL (ref 78.0–100.0)
PLATELETS: 179 10*3/uL (ref 150–400)
RBC: 3.26 MIL/uL — AB (ref 3.87–5.11)
RDW: 13.1 % (ref 11.5–15.5)
WBC: 10.7 10*3/uL — ABNORMAL HIGH (ref 4.0–10.5)

## 2017-03-27 LAB — URINALYSIS, ROUTINE W REFLEX MICROSCOPIC
Bilirubin Urine: NEGATIVE
GLUCOSE, UA: NEGATIVE mg/dL
Hgb urine dipstick: NEGATIVE
Ketones, ur: NEGATIVE mg/dL
Leukocytes, UA: NEGATIVE
NITRITE: NEGATIVE
PROTEIN: 30 mg/dL — AB
SPECIFIC GRAVITY, URINE: 1.019 (ref 1.005–1.030)
WBC UA: NONE SEEN WBC/hpf (ref 0–5)
pH: 9 — ABNORMAL HIGH (ref 5.0–8.0)

## 2017-03-27 LAB — TYPE AND SCREEN
ABO/RH(D): O POS
Antibody Screen: NEGATIVE

## 2017-03-27 LAB — CULTURE, BETA STREP (GROUP B ONLY): Strep Gp B Culture: NEGATIVE

## 2017-03-27 MED ORDER — DIPHENHYDRAMINE HCL 50 MG/ML IJ SOLN: 12.5000 mg | INTRAMUSCULAR | Status: DC | PRN

## 2017-03-27 MED ORDER — LIDOCAINE HCL (PF) 1 % IJ SOLN
30.0000 mL | INTRAMUSCULAR | Status: DC | PRN
  Filled 2017-03-27: qty 30

## 2017-03-27 MED ORDER — OXYTOCIN 40 UNITS IN LACTATED RINGERS INFUSION - SIMPLE MED
2.5000 [IU]/h | INTRAVENOUS | Status: DC
  Filled 2017-03-27: qty 1000

## 2017-03-27 MED ORDER — BETAMETHASONE SOD PHOS & ACET 6 (3-3) MG/ML IJ SUSP
12.0000 mg | Freq: Once | INTRAMUSCULAR | Status: AC
  Administered 2017-03-27: 12 mg via INTRAMUSCULAR
  Filled 2017-03-27: qty 2

## 2017-03-27 MED ORDER — FENTANYL CITRATE (PF) 100 MCG/2ML IJ SOLN
100.0000 ug | INTRAMUSCULAR | Status: DC | PRN
  Administered 2017-03-27: 100 ug via INTRAVENOUS
  Filled 2017-03-27: qty 2

## 2017-03-27 MED ORDER — PHENYLEPHRINE 40 MCG/ML (10ML) SYRINGE FOR IV PUSH (FOR BLOOD PRESSURE SUPPORT)
80.0000 ug | PREFILLED_SYRINGE | INTRAVENOUS | Status: DC | PRN
  Filled 2017-03-27: qty 10
  Filled 2017-03-27: qty 5

## 2017-03-27 MED ORDER — LACTATED RINGERS IV SOLN: 500.0000 mL | INTRAVENOUS | Status: DC | PRN

## 2017-03-27 MED ORDER — EPHEDRINE 5 MG/ML INJ
10.0000 mg | INTRAVENOUS | Status: DC | PRN
  Filled 2017-03-27: qty 2

## 2017-03-27 MED ORDER — LACTATED RINGERS IV SOLN: 500.0000 mL | Freq: Once | INTRAVENOUS | Status: DC

## 2017-03-27 MED ORDER — SOD CITRATE-CITRIC ACID 500-334 MG/5ML PO SOLN: 30.0000 mL | ORAL | Status: DC | PRN

## 2017-03-27 MED ORDER — FLEET ENEMA 7-19 GM/118ML RE ENEM: 1.0000 | ENEMA | RECTAL | Status: DC | PRN

## 2017-03-27 MED ORDER — OXYTOCIN BOLUS FROM INFUSION: 500.0000 mL | Freq: Once | INTRAVENOUS | Status: DC

## 2017-03-27 MED ORDER — PHENYLEPHRINE 40 MCG/ML (10ML) SYRINGE FOR IV PUSH (FOR BLOOD PRESSURE SUPPORT)
80.0000 ug | PREFILLED_SYRINGE | INTRAVENOUS | Status: DC | PRN
  Filled 2017-03-27: qty 5

## 2017-03-27 MED ORDER — ONDANSETRON HCL 4 MG/2ML IJ SOLN: 4.0000 mg | Freq: Four times a day (QID) | INTRAMUSCULAR | Status: DC | PRN

## 2017-03-27 MED ORDER — ACETAMINOPHEN 325 MG PO TABS: 650.0000 mg | ORAL_TABLET | ORAL | Status: DC | PRN

## 2017-03-27 MED ORDER — LACTATED RINGERS IV SOLN
INTRAVENOUS | Status: DC
  Administered 2017-03-27 – 2017-03-28 (×3): via INTRAVENOUS

## 2017-03-27 MED ORDER — ZOLPIDEM TARTRATE 5 MG PO TABS: 5.0000 mg | ORAL_TABLET | Freq: Every evening | ORAL | Status: DC | PRN

## 2017-03-27 MED ORDER — OXYCODONE-ACETAMINOPHEN 5-325 MG PO TABS: 1.0000 | ORAL_TABLET | ORAL | Status: DC | PRN

## 2017-03-27 MED ORDER — FENTANYL 2.5 MCG/ML BUPIVACAINE 1/10 % EPIDURAL INFUSION (WH - ANES)
14.0000 mL/h | INTRAMUSCULAR | Status: DC | PRN
  Administered 2017-03-28 (×2): 14 mL/h via EPIDURAL
  Filled 2017-03-27 (×2): qty 100

## 2017-03-27 MED ORDER — OXYCODONE-ACETAMINOPHEN 5-325 MG PO TABS: 2.0000 | ORAL_TABLET | ORAL | Status: DC | PRN

## 2017-03-27 NOTE — H&P (Signed)
History   CSN: [id number]  Arrival date and time: 03/27/17 4098    First Provider Initiated Contact with Patient 03/27/17 2031         Chief Complaint  Patient presents with  . Contractions   HPI Tina Mays is a 26 y.o. [id number] at [redacted]w[redacted]d by 15wk scan who presents with contractions. Started 2 hours ago & occur every 6 minutes. Rates pain 7/10. Denies LOF or vaginal bleeding. Positive fetal movement. Cervix dilated 1.5-2 cm on Monday in the office.   Her preg has been followed by the [facility] office and has been essentially unremarkable w/ hx of two previous full term deliveries. Negative GBS from 03/23/17.          OB History    Gravida Para Term Preterm AB Living   SAB TAB Ectopic Multiple Live Births   1 0     2          Past Medical History:  Diagnosis Date  . Chlamydia   . Infection    UTI  . Ovarian cyst   . Pre-eclampsia   . Pregnancy induced hypertension    with first preg         Past Surgical History:  Procedure Laterality Date  . WISDOM TOOTH EXTRACTION           Family History  Problem Relation Age of Onset  . Hypertension Mother   . Diabetes Father   . Hypertension Father   . Stroke Father   . Heart disease Father   . Kidney disease Father         Social History  Substance Use Topics  . Smoking status: Never Smoker  . Smokeless tobacco: Never Used  . Alcohol use 0.0 oz/week    Allergies: No Known Allergies         Prescriptions Prior to Admission  Medication Sig Dispense Refill Last Dose  . calcium carbonate (TUMS - DOSED IN MG ELEMENTAL CALCIUM) 500 MG chewable tablet Chew 2 tablets by mouth at bedtime as needed for indigestion or heartburn.   03/26/2017 at Unknown time  . loratadine (CLARITIN) 10 MG tablet Take 1 tablet (10 mg total) by mouth daily. 30 tablet 11 Past Week at Unknown time  . Prenat-Fe Poly-Methfol-FA-DHA (VITAFOL ULTRA) 29-0.6-0.4-200 MG CAPS Take 1 capsule by  mouth daily. 30 capsule 11 03/27/2017 at Unknown time    Review of Systems  Constitutional: Negative.   Gastrointestinal: Positive for abdominal pain.  Genitourinary: Negative for vaginal bleeding and vaginal discharge.   Physical Exam   Blood pressure 134/76, pulse 100, temperature 98.7 F (37.1 C), temperature source Oral, resp. rate 17, height  (1.6 m), weight 202 lb (91.6 kg), last menstrual period 03/08/2016.  Physical Exam  Nursing note and vitals reviewed. Constitutional: She is oriented to person, place, and time. She appears well-developed and well-nourished. No distress.  HENT:  Head: Normocephalic and atraumatic.  Eyes: Conjunctivae are normal. Right eye exhibits no discharge. Left eye exhibits no discharge. No scleral icterus.  Neck: Normal range of motion.  Respiratory: Effort normal. No respiratory distress.  Neurological: She is alert and oriented to person, place, and time.  Skin: Skin is warm and dry. She is not diaphoretic.  Psychiatric: She has a normal mood and affect. Her behavior is normal. Judgment and thought content normal.   Dilation: 5.5 Effacement (%): 80 Station: -3 Presentation: Vertex Exam by:: Estanislado Spire  Fetal Tracing:  Baseline: 145 Variability: moderate Accelerations: 15x15 Decelerations: variable  Toco: every 3-7 mins, 70-120 sec MAU Course  Procedures Lab Results Last 24 Hours  No results found for this or any previous visit (from the past 24 hour(s)).    MDM Discussed admission with Dr. Shawnie Pons  Assessment and Plan  A: 1. Active preterm labor, single or unspecified fetus    P: Admit to birthing Suites BMZ ordered Neg GBS (03/23/17) Expectant management for now  Cam Hai CNM 03/27/2017 10:48 PM

## 2017-03-27 NOTE — MAU Provider Note (Signed)
History     CSN: [id number]  Arrival date and time: 03/27/17 7829    First Provider Initiated Contact with Patient 03/27/17 2031      Chief Complaint  Patient presents with  . Contractions   HPI Tina Mays is a 26 y.o. [id number] at [redacted]w[redacted]d who presents with contractions. Started 2 hours ago & occur every 6 minutes. Rates pain 7/10. Denies LOF or vaginal bleeding. Positive fetal movement. Cervix dilated 1.5-2 cm on Monday in the office.   OB History    Gravida Para Term Preterm AB Living   SAB TAB Ectopic Multiple Live Births   1 0     2      Past Medical History:  Diagnosis Date  . Chlamydia   . Infection    UTI  . Ovarian cyst   . Pre-eclampsia   . Pregnancy induced hypertension    with first preg    Past Surgical History:  Procedure Laterality Date  . WISDOM TOOTH EXTRACTION      Family History  Problem Relation Age of Onset  . Hypertension Mother   . Diabetes Father   . Hypertension Father   . Stroke Father   . Heart disease Father   . Kidney disease Father     Social History  Substance Use Topics  . Smoking status: Never Smoker  . Smokeless tobacco: Never Used  . Alcohol use 0.0 oz/week    Allergies: No Known Allergies  Prescriptions Prior to Admission  Medication Sig Dispense Refill Last Dose  . calcium carbonate (TUMS - DOSED IN MG ELEMENTAL CALCIUM) 500 MG chewable tablet Chew 2 tablets by mouth at bedtime as needed for indigestion or heartburn.   03/26/2017 at Unknown time  . loratadine (CLARITIN) 10 MG tablet Take 1 tablet (10 mg total) by mouth daily. 30 tablet 11 Past Week at Unknown time  . Prenat-Fe Poly-Methfol-FA-DHA (VITAFOL ULTRA) 29-0.6-0.4-200 MG CAPS Take 1 capsule by mouth daily. 30 capsule 11 03/27/2017 at Unknown time    Review of Systems  Constitutional: Negative.   Gastrointestinal: Positive for abdominal pain.  Genitourinary: Negative for vaginal bleeding and vaginal discharge.   Physical Exam   Blood  pressure 134/76, pulse 100, temperature 98.7 F (37.1 C), temperature source Oral, resp. rate 17, height  (1.6 m), weight 202 lb (91.6 kg), last menstrual period 03/08/2016.  Physical Exam  Nursing note and vitals reviewed. Constitutional: She is oriented to person, place, and time. She appears well-developed and well-nourished. No distress.  HENT:  Head: Normocephalic and atraumatic.  Eyes: Conjunctivae are normal. Right eye exhibits no discharge. Left eye exhibits no discharge. No scleral icterus.  Neck: Normal range of motion.  Respiratory: Effort normal. No respiratory distress.  Neurological: She is alert and oriented to person, place, and time.  Skin: Skin is warm and dry. She is not diaphoretic.  Psychiatric: She has a normal mood and affect. Her behavior is normal. Judgment and thought content normal.   Dilation: 5.5 Effacement (%): 80 Station: -3 Presentation: Vertex Exam by:: E. Kinsleigh Ludolph  Fetal Tracing:  Baseline: 145 Variability: moderate Accelerations: 15x15 Decelerations: variable  Toco: every 3-7 mins, 70-120 sec MAU Course  Procedures No results found for this or any previous visit (from the past 24 hour(s)).  MDM Discussed admission with Dr. Shawnie Pons  Assessment and Plan  A: 1. Active preterm labor, single or unspecified fetus    P: Admit to birthing suites Report  given to Philipp Deputy CNM BMZ ordered  Judeth Horn 03/27/2017, 8:29 PM

## 2017-03-27 NOTE — MAU Note (Signed)
Contractions for couple hours.Denies LOF or bleeding 

## 2017-03-27 NOTE — Anesthesia Preprocedure Evaluation (Signed)
Anesthesia Evaluation  Patient identified by MRN, date of birth, ID band Patient awake    Reviewed: Allergy & Precautions, NPO status , Patient's Chart, lab work & pertinent test results  Airway Mallampati: II  TM Distance: >3 FB Neck ROM: Full    Dental no notable dental hx. (+) Teeth Intact   Pulmonary neg pulmonary ROS,    Pulmonary exam normal breath sounds clear to auscultation       Cardiovascular hypertension, Normal cardiovascular exam Rhythm:Regular Rate:Normal     Neuro/Psych negative neurological ROS  negative psych ROS   GI/Hepatic Neg liver ROS, GERD  Medicated and Controlled,  Endo/Other  Obesity  Renal/GU negative Renal ROS  negative genitourinary   Musculoskeletal negative musculoskeletal ROS (+)   Abdominal (+) + obese,   Peds  Hematology  (+) anemia ,   Anesthesia Other Findings   Reproductive/Obstetrics (+) Pregnancy                             Anesthesia Physical Anesthesia Plan  ASA: II  Anesthesia Plan: Epidural   Post-op Pain Management:    Induction:   Airway Management Planned: Natural Airway  Additional Equipment:   Intra-op Plan:   Post-operative Plan:   Informed Consent: I have reviewed the patients History and Physical, chart, labs and discussed the procedure including the risks, benefits and alternatives for the proposed anesthesia with the patient or authorized representative who has indicated his/her understanding and acceptance.     Plan Discussed with: Anesthesiologist  Anesthesia Plan Comments:         Anesthesia Quick Evaluation

## 2017-03-28 ENCOUNTER — Encounter (HOSPITAL_COMMUNITY): Payer: Self-pay | Admitting: *Deleted

## 2017-03-28 DIAGNOSIS — Z3A35 35 weeks gestation of pregnancy: Secondary | ICD-10-CM

## 2017-03-28 LAB — RPR: RPR Ser Ql: NONREACTIVE

## 2017-03-28 MED ORDER — DIPHENHYDRAMINE HCL 25 MG PO CAPS: 25.0000 mg | ORAL_CAPSULE | Freq: Four times a day (QID) | ORAL | Status: DC | PRN

## 2017-03-28 MED ORDER — SIMETHICONE 80 MG PO CHEW: 80.0000 mg | CHEWABLE_TABLET | ORAL | Status: DC | PRN

## 2017-03-28 MED ORDER — IBUPROFEN 600 MG PO TABS
600.0000 mg | ORAL_TABLET | Freq: Four times a day (QID) | ORAL | Status: DC
  Administered 2017-03-28 – 2017-03-30 (×9): 600 mg via ORAL
  Filled 2017-03-28 (×9): qty 1

## 2017-03-28 MED ORDER — OXYTOCIN 40 UNITS IN LACTATED RINGERS INFUSION - SIMPLE MED: 2.5000 [IU]/h | INTRAVENOUS | Status: DC | PRN

## 2017-03-28 MED ORDER — BENZOCAINE-MENTHOL 20-0.5 % EX AERO
1.0000 "application " | INHALATION_SPRAY | CUTANEOUS | Status: DC | PRN
  Administered 2017-03-28: 1 via TOPICAL
  Filled 2017-03-28: qty 56

## 2017-03-28 MED ORDER — PRENATAL MULTIVITAMIN CH
1.0000 | ORAL_TABLET | Freq: Every day | ORAL | Status: DC
  Administered 2017-03-28 – 2017-03-30 (×3): 1 via ORAL
  Filled 2017-03-28 (×2): qty 1

## 2017-03-28 MED ORDER — WITCH HAZEL-GLYCERIN EX PADS: 1.0000 "application " | MEDICATED_PAD | CUTANEOUS | Status: DC | PRN

## 2017-03-28 MED ORDER — SENNOSIDES-DOCUSATE SODIUM 8.6-50 MG PO TABS
2.0000 | ORAL_TABLET | ORAL | Status: DC
  Administered 2017-03-28 – 2017-03-30 (×2): 2 via ORAL
  Filled 2017-03-28 (×2): qty 2

## 2017-03-28 MED ORDER — TETANUS-DIPHTH-ACELL PERTUSSIS 5-2.5-18.5 LF-MCG/0.5 IM SUSP: 0.5000 mL | Freq: Once | INTRAMUSCULAR | Status: DC

## 2017-03-28 MED ORDER — ONDANSETRON HCL 4 MG/2ML IJ SOLN: 4.0000 mg | INTRAMUSCULAR | Status: DC | PRN

## 2017-03-28 MED ORDER — ZOLPIDEM TARTRATE 5 MG PO TABS: 5.0000 mg | ORAL_TABLET | Freq: Every evening | ORAL | Status: DC | PRN

## 2017-03-28 MED ORDER — SODIUM CHLORIDE 0.9% FLUSH: 3.0000 mL | Freq: Two times a day (BID) | INTRAVENOUS | Status: DC

## 2017-03-28 MED ORDER — SODIUM CHLORIDE 0.9% FLUSH: 3.0000 mL | INTRAVENOUS | Status: DC | PRN

## 2017-03-28 MED ORDER — SODIUM CHLORIDE 0.9 % IV SOLN: 250.0000 mL | INTRAVENOUS | Status: DC | PRN

## 2017-03-28 MED ORDER — ONDANSETRON HCL 4 MG PO TABS: 4.0000 mg | ORAL_TABLET | ORAL | Status: DC | PRN

## 2017-03-28 MED ORDER — LIDOCAINE HCL (PF) 1 % IJ SOLN
INTRAMUSCULAR | Status: DC | PRN
  Administered 2017-03-27 (×2): 4 mL via EPIDURAL

## 2017-03-28 MED ORDER — DIBUCAINE 1 % RE OINT: 1.0000 "application " | TOPICAL_OINTMENT | RECTAL | Status: DC | PRN

## 2017-03-28 MED ORDER — ACETAMINOPHEN 325 MG PO TABS
650.0000 mg | ORAL_TABLET | ORAL | Status: DC | PRN
  Administered 2017-03-28 (×2): 650 mg via ORAL
  Filled 2017-03-28 (×2): qty 2

## 2017-03-28 MED ORDER — COCONUT OIL OIL
1.0000 "application " | TOPICAL_OIL | Status: DC | PRN
  Administered 2017-03-29: 1 via TOPICAL
  Filled 2017-03-28: qty 120

## 2017-03-28 NOTE — Anesthesia Postprocedure Evaluation (Signed)
Anesthesia Post Note  Patient: Tina Mays  Procedure(s) Performed: * No procedures listed *  Patient location during evaluation: Mother Baby Anesthesia Type: Epidural Level of consciousness: awake and alert, oriented and patient cooperative Pain management: pain level controlled Vital Signs Assessment: post-procedure vital signs reviewed and stable Respiratory status: spontaneous breathing Cardiovascular status: stable Postop Assessment: no headache, epidural receding, patient able to bend at knees and no signs of nausea or vomiting Anesthetic complications: no Comments: Pain score 1.        Last Vitals:  Vitals:   03/28/17 1055 03/28/17 1518  BP: 118/60 (!) 114/56  Pulse: 66 73  Resp: 18 18  Temp: 36.6 C 36.8 C    Last Pain:  Vitals:   03/28/17 1518  TempSrc: Oral  PainSc:    Pain Goal:                 [facility]

## 2017-03-28 NOTE — Progress Notes (Signed)
[name], female   DOB: 12-17-1990, 26 y.o.   MRN: [id number]  Comfortable w/ epidural; BMZ x 1 dose VSS, afeb FHR 120s, occ early variables Ctx irreg Cx 9+/C/+1  IUP@35 .3wks Active labor/transition  Will begin pushing w/ urge  Clelia Croft, KIMBERLY CNM 03/28/2017 6:28 AM

## 2017-03-28 NOTE — Anesthesia Procedure Notes (Signed)
Epidural Patient location during procedure: OB Start time: 03/27/2017 11:57 PM  Staffing Anesthesiologist: Mal Amabile Performed: anesthesiologist   Preanesthetic Checklist Completed: patient identified, site marked, surgical consent, pre-op evaluation, timeout performed, IV checked, risks and benefits discussed and monitors and equipment checked  Epidural Patient position: sitting Prep: site prepped and draped and DuraPrep Patient monitoring: continuous pulse ox and blood pressure Approach: midline Location: L4-L5 Injection technique: LOR air  Needle:  Needle type: Tuohy  Needle gauge: 17 G Needle length: 9 cm and 9 Needle insertion depth: 6 cm Catheter type: closed end flexible Catheter size: 19 Gauge Catheter at skin depth: 10 cm Test dose: negative and Other  Assessment Events: blood not aspirated, injection not painful, no injection resistance, negative IV test and no paresthesia  Additional Notes Patient identified. Risks and benefits discussed including failed block, incomplete  Pain control, post dural puncture headache, nerve damage, paralysis, blood pressure Changes, nausea, vomiting, reactions to medications-both toxic and allergic and post Partum back pain. All questions were answered. Patient expressed understanding and wished to proceed. Sterile technique was used throughout procedure. Epidural site was Dressed with sterile barrier dressing. No paresthesias, signs of intravascular injection Or signs of intrathecal spread were encountered.  Patient was more comfortable after the epidural was dosed. Please see RN's note for documentation of vital signs and FHR which are stable. Attempt x 2. Heme on 1st attempt at L3-4

## 2017-03-28 NOTE — Lactation Note (Signed)
This note was copied from a baby's chart. Lactation Consultation Note  Patient Name: Tina Mays [id number] Date: 03/28/2017 Reason for consult: Initial assessment  LPI 12 hours old. Mom reports that she nursed her first child, but her milk did not come in at all with her second child--born 3 weeks premature. Mom pumping when this LC entered the room and reports that she has only seen a few tiny dots of colostrum while baby at the breast. Assisted mom with hand expression with no colostrum present. Reviewed LPI behavior and guidelines and enc mom to put baby to breast with cues and at least every 3 hours. Enc mom to supplement baby with formula according to guidelines, and mom reports that she is supplementing with curve-tipped syringe at the breast. Enc mom to post-pump after baby fed for 15 minutes followed by hand expression.   Mom given [facility] brochure, aware of OP/BFSG and LC phone line assistance after D/C.    Maternal Data Has patient been taught Hand Expression?: Yes Does the patient have breastfeeding experience prior to this delivery?: Yes  Feeding Feeding Type: Breast Fed  LATCH Score/Interventions                      Lactation Tools Discussed/Used     Consult Status Consult Status: Follow-up Date: 03/29/17 Follow-up type: In-patient    Sherlyn Hay 03/28/2017, 9:15 PM

## 2017-03-29 NOTE — Lactation Note (Signed)
This note was copied from a baby's chart. Lactation Consultation Note  Patient Name: Boy Shanda Cadotte ZOXWR'U Date: 03/29/2017 Reason for consult: Follow-up assessment;Late preterm infant;Infant < 6lbs Mom is currently breastfeeding baby using football hold.  Baby is latched deeply and nursing actively.  Mom states she puts baby to breast first and then gives formula supplementation.  Instructed to give 10-20 mls each feeding.  Stressed importance of pumping every 3 hours to stimulate milk supply.  Encouraged to call for assist/concerns.  Maternal Data    Feeding Feeding Type: Bottle Fed - Formula  LATCH Score/Interventions Latch: Grasps breast easily, tongue down, lips flanged, rhythmical sucking.  Audible Swallowing: A few with stimulation Intervention(s): Alternate breast massage  Type of Nipple: Everted at rest and after stimulation  Comfort (Breast/Nipple): Soft / non-tender     Hold (Positioning): No assistance needed to correctly position infant at breast. Intervention(s): Breastfeeding basics reviewed;Support Pillows  LATCH Score: 9  Lactation Tools Discussed/Used     Consult Status Consult Status: Follow-up Date: 03/30/17 Follow-up type: In-patient    Huston Foley 03/29/2017, 12:44 PM

## 2017-03-29 NOTE — Progress Notes (Signed)
[name], female   DOB: 01/09/1991, 26 y.o.   MRN: [id number]  POSTPARTUM PROGRESS NOTE  Post Partum Day #1 Subjective:  Tina Mays is a 26 y.o. [id number] [redacted]w[redacted]d s/p NSVD.  No acute events overnight.  Pt denies problems with ambulating, voiding or po intake.  She denies nausea or vomiting.  Pain is well controlled.  She has had flatus. She has not had bowel movement.  Lochia Small.   Objective: Blood pressure 113/64, pulse (!) 58, temperature 98.1 F (36.7 C), resp. rate 16, height  (1.6 m), weight 202 lb (91.6 kg), last menstrual period 03/08/2016, SpO2 100 %, unknown if currently breastfeeding.  Physical Exam:  General: alert, cooperative and no distress Lochia:normal flow Chest: CTAB Heart: RRR no m/r/g Abdomen: +BS, soft, nontender,  Uterine Fundus: firm, below umbilicus DVT Evaluation: No calf swelling or tenderness Extremities: Trace edema   Recent Labs  03/27/17 2051  HGB 9.5*  HCT 28.2*    Assessment/Plan:  ASSESSMENT: Tina Mays is a 26 y.o. O9G2952 [redacted]w[redacted]d s/p NSVD  Plan for discharge tomorrow, Breastfeeding, Lactation consult and Contraception Mirena (IUD)   LOS: 2 days   Jen Mow, DO OB Fellow Center for [facility], [facility]  03/29/2017, 9:20 AM

## 2017-03-30 ENCOUNTER — Other Ambulatory Visit: Payer: Self-pay | Admitting: Certified Nurse Midwife

## 2017-03-30 DIAGNOSIS — Z3480 Encounter for supervision of other normal pregnancy, unspecified trimester: Secondary | ICD-10-CM

## 2017-03-30 MED ORDER — IBUPROFEN 600 MG PO TABS: 600.0000 mg | ORAL_TABLET | Freq: Four times a day (QID) | ORAL | 2 refills | Status: DC

## 2017-03-30 MED ORDER — CITRANATAL BLOOM 90-1 MG PO TABS: 1.0000 | ORAL_TABLET | Freq: Every day | ORAL | 12 refills | Status: DC

## 2017-03-30 NOTE — Progress Notes (Signed)
Post discharge chart review completed.  

## 2017-03-30 NOTE — Discharge Summary (Signed)
OB Discharge Summary     Patient Name: Tina Mays DOB: 02-Jan-1991 MRN: [id number]  Date of admission: 03/27/2017 Delivering MD: Jen Mow [facility]   Date of discharge: 03/30/2017  Admitting diagnosis: 35wks ctx Intrauterine pregnancy: [redacted]w[redacted]d     Secondary diagnosis:  Active Problems:   Indication for care in labor and delivery, antepartum  Additional problems:Preterm delivery  with PPROM     Discharge diagnosis: Preterm Pregnancy Delivered, Anemia                                                                                                Post partum procedures:n/a  Augmentation: none  Complications: None  Hospital course:  Onset of Labor With Vaginal Delivery     26 y.o. yo [id number] at [redacted]w[redacted]d was admitted in Active Labor on 03/27/2017. Patient had an uncomplicated labor course as follows:  Membrane Rupture Time/Date: 11:18 PM ,03/27/2017   Intrapartum Procedures: Episiotomy: None [1]                                         Lacerations:  None [1]  Patient had a delivery of a Viable infant. 03/28/2017  Information for the patient's newborn:  Reaghan, Kawa [[id number]]       Pateint had an uncomplicated postpartum course.  She is ambulating, tolerating a regular diet, passing flatus, and urinating well. Patient is discharged home in stable condition on 03/30/17.   Physical exam  Vitals:   03/28/17 1742 03/29/17 0700 03/29/17 1800 03/30/17 0500  BP: (!) 109/50 113/64 125/78 120/64  Pulse: 86 (!) 58 91 78  Resp: Temp: 98.8 F (37.1 C) 98.1 F (36.7 C) 98.8 F (37.1 C) 98.9 F (37.2 C)  TempSrc: Oral     SpO2: 100%  100%   Weight:      Height:       General: alert, cooperative and no distress Lochia: appropriate Uterine Fundus: firm Incision: N/A DVT Evaluation: No evidence of DVT seen on physical exam. No cords or calf tenderness. No significant calf/ankle edema. Labs: Lab Results  Component Value Date   WBC 10.7 (H)  03/27/2017   HGB 9.5 (L) 03/27/2017   HCT 28.2 (L) 03/27/2017   MCV 86.5 03/27/2017   PLT 179 03/27/2017   CMP Latest Ref Rng & Units 12/09/2008  Glucose 70 - 99 mg/dL 130(Q)  BUN 6 - 23 mg/dL 5(L)  Creatinine 0.4 - 1.2 mg/dL 6.57  Sodium 846 - 962 mEq/L 138  Potassium 3.5 - 5.1 mEq/L 3.7  Chloride 96 - 112 mEq/L 102  CO2 19 - 32 mEq/L 27  Calcium 8.4 - 10.5 mg/dL 7.1(L)  Total Protein 6.0 - 8.3 g/dL 9.5(M)  Total Bilirubin 0.3 - 1.2 mg/dL 0.3  Alkaline Phos 39 - 117 U/L 163(H)  AST 0 - 37 U/L 27  ALT 0 - 35 U/L 13    Discharge instruction: per After Visit Summary and "Baby and Me Booklet".  After visit meds:  Allergies as of 03/30/2017   No Known Allergies     Medication List    STOP taking these medications   calcium carbonate 500 MG chewable tablet Commonly known as:  TUMS - dosed in mg elemental calcium   DIFLUCAN 150 MG tablet Generic drug:  fluconazole     TAKE these medications   CITRANATAL BLOOM 90-1 MG Tabs Take 1 tablet by mouth daily.   ibuprofen 600 MG tablet Commonly known as:  ADVIL,MOTRIN Take 1 tablet (600 mg total) by mouth every 6 (six) hours.   loratadine 10 MG tablet Commonly known as:  CLARITIN Take 1 tablet (10 mg total) by mouth daily.   VITAFOL ULTRA 29-0.6-0.4-200 MG Caps Take 1 capsule by mouth daily.       Diet: routine diet  Activity: Advance as tolerated. Pelvic rest for 6 weeks.   Outpatient follow up:6 weeks Follow up Appt:Future Appointments Date Time Provider Department Center  03/31/2017 2:00 PM Foster Sonnier A Marjo Bicker, CNM [facility] None   Follow up Visit:No Follow-up on file.  Postpartum contraception: IUD Mirena  Newborn Data: Live born female  Birth Weight: 5 lb 15.8 oz (2715 g) APGAR: 7, 9  Baby Feeding: Breast Disposition:home with mother or rooming in.    03/30/2017 Roe Coombs, CNM

## 2017-03-30 NOTE — Lactation Note (Addendum)
This note was copied from a baby's chart. Lactation Consultation Note  Patient Name: Tina Mays ZOXWR'U Date: 03/30/2017 Reason for consult: Follow-up assessment;Infant < 6lbs;Late preterm infant Mom called for LC to observe latch. Mom latched independently but LC encouraged Mom to support breast with feedings and LC assisted Mom to obtain more depth with latch. Baby very sleepy at breast, lots of stimulation to stay engaged. Reviewed massage with nursing to help baby transfer milk.  Advised Mom she could try to BF 15 minutes both breast each feeding or could BF for up to 30 minutes 1st breast and then next feeding BF for 30 minutes. Since she does not have DEBP encouraged to BF both breasts each feeding for stimulation then pump/supplement.  Encouraged to pump after feedings to encourage milk production and protect milk supply. Mom to continue to supplement with EBM/formula till baby transferring milk better. Increase supplement today to 20-30 ml with each feeding every 3 hours increasing each day per LPT policy and to satisfy baby. Mom does not want to rent [facility] loaner. Mom to call for questions/concerns. Advised of OP services and support group.   Maternal Data    Feeding Feeding Type: Breast Fed Length of feed: 13 min  LATCH Score/Interventions Latch: Grasps breast easily, tongue down, lips flanged, rhythmical sucking.  Audible Swallowing: None  Type of Nipple: Everted at rest and after stimulation  Comfort (Breast/Nipple): Soft / non-tender     Hold (Positioning): Assistance needed to correctly position infant at breast and maintain latch.  LATCH Score: 7  Lactation Tools Discussed/Used Tools: Pump Breast pump type: Double-Electric Breast Pump   Consult Status Consult Status: Complete Date: 03/30/17 Follow-up type: In-patient    Alfred Levins 03/30/2017, 10:43 AM

## 2017-03-30 NOTE — Progress Notes (Signed)
Post Partum Day #2 Subjective: no complaints, up ad lib, voiding and tolerating PO  Objective: Blood pressure 120/64, pulse 78, temperature 98.9 F (37.2 C), resp. rate 18, height  (1.6 m), weight 202 lb (91.6 kg), last menstrual period 03/08/2016, SpO2 100 %, unknown if currently breastfeeding.  Physical Exam:  General: alert, cooperative and no distress Lochia: appropriate Uterine Fundus: firm Incision: n/a DVT Evaluation: No evidence of DVT seen on physical exam. No cords or calf tenderness. No significant calf/ankle edema.   Recent Labs  03/27/17 2051  HGB 9.5*  HCT 28.2*    Assessment/Plan: Discharge home, Breastfeeding and Contraception Mirena IUD.  Anemia: asymptomatic.   LOS: 3 days   Roe Coombs, CNM 03/30/2017, 7:24 AM

## 2017-03-30 NOTE — Lactation Note (Signed)
This note was copied from a baby's chart. Lactation Consultation Note  Patient Name: Tina Mays ZOXWR'U Date: 03/30/2017 Reason for consult: Follow-up assessment;Infant < 6lbs;Late preterm infant Mom reports that she is BF then supplementing with formula with each feeding. Mom reports she milk is coming in. [facility] encouraged Mom to be pumping after feedings to prevent engorgement, have EBM to supplement and protect milk supply. Supply/demand discussed and advised LPT babies can have difficulty emptying breast well due to early gestational status. Mom has [employer] pump for home, encouraged [facility] loaner but Mom declined for now. WIC referral faxed to [place] office and Mom called to leave message to schedule appointment and see if able to get pump after d/c today. Advised baby needs to be at breast 8-12 time in 24 hours and with feeding ques. Engorgement care reviewed. BM storage guidelines discussed. LC left phone number for Mom to call with next feeding for LC to observe latch.   Maternal Data    Feeding Feeding Type: Breast Fed Length of feed: 15 min  LATCH Score/Interventions                      Lactation Tools Discussed/Used Tools: Pump Breast pump type: Double-Electric Breast Pump   Consult Status Consult Status: Follow-up Date: 03/30/17 Follow-up type: In-patient    Alfred Levins 03/30/2017, 9:43 AM

## 2017-03-31 ENCOUNTER — Encounter: Payer: Medicaid Other | Admitting: Certified Nurse Midwife

## 2017-05-07 ENCOUNTER — Encounter: Payer: Self-pay | Admitting: Obstetrics and Gynecology

## 2017-05-07 ENCOUNTER — Ambulatory Visit (INDEPENDENT_AMBULATORY_CARE_PROVIDER_SITE_OTHER): Payer: Medicaid Other | Admitting: Obstetrics and Gynecology

## 2017-05-07 ENCOUNTER — Encounter: Payer: Self-pay | Admitting: *Deleted

## 2017-05-07 VITALS — BP 114/72 | HR 66 | Ht 63.5 in | Wt 187.0 lb

## 2017-05-07 DIAGNOSIS — Z30430 Encounter for insertion of intrauterine contraceptive device: Secondary | ICD-10-CM

## 2017-05-07 DIAGNOSIS — Z3202 Encounter for pregnancy test, result negative: Secondary | ICD-10-CM

## 2017-05-07 LAB — POCT URINE PREGNANCY: Preg Test, Ur: NEGATIVE

## 2017-05-07 MED ORDER — LEVONORGESTREL 20 MCG/24HR IU IUD
INTRAUTERINE_SYSTEM | Freq: Once | INTRAUTERINE | Status: AC
  Administered 2017-05-07: 11:00:00 via INTRAUTERINE

## 2017-05-07 NOTE — Progress Notes (Signed)
Post Partum Exam  Tina Mays is a 26 y.o. [phone] female who presents for a postpartum visit. She is 6 weeks postpartum following a SVD. I have fully reviewed the prenatal and intrapartum course. The delivery was at 9239w3d gestational weeks.  Anesthesia: epidural. Postpartum course has been unremarkable. Baby's course has been unremarkable. Baby is feeding by bottle - Similac Neosure. Bleeding no bleeding. Bowel function is normal. Bladder function is normal. Patient is not sexually active. Contraception method is abstinence. Postpartum depression screening:neg. EPDS: 9     Review of Systems Pertinent items are noted in HPI.    Objective:  Blood pressure 114/72, pulse 66, height 5' 3.5" (1.613 m), weight 187 lb (84.8 kg), unknown if currently breastfeeding.  General:  alert, cooperative and no distress   Breasts:  inspection negative, no nipple discharge or bleeding, no masses or nodularity palpable  Lungs: clear to auscultation bilaterally  Heart:  regular rate and rhythm  Abdomen: soft, non-tender; bowel sounds normal; no masses,  no organomegaly   Vulva:  normal  Vagina: normal vagina, no discharge, exudate, lesion, or erythema  Cervix:  multiparous appearance  Corpus: normal size, contour, position, consistency, mobility, non-tender  Adnexa:  normal adnexa  Rectal Exam: Not performed.        Assessment:    Normal postpartum exam. Pap smear not done at today's visit (normal pap 10/2016).   Plan:   1. Contraception: IUD  IUD Procedure Note Patient identified, informed consent performed, signed copy in chart, time out was performed.  Urine pregnancy test negative.  Speculum placed in the vagina.  Cervix visualized.  Cleaned with Betadine x 2.  Grasped anteriorly with a single tooth tenaculum.  Uterus sounded to 9 cm.  Mirena IUD placed per manufacturer's recommendations.  Strings trimmed to 3 cm. Tenaculum was removed, good hemostasis noted.  Patient tolerated procedure well.    Patient given post procedure instructions and Mirena care card with expiration date.  Patient is asked to check IUD strings periodically and follow up in 4-6 weeks for IUD check.    2. Patient is medically cleared to resume all activities of daily living 3. Follow up in: 4 weeks for IUD checkor as needed.

## 2017-06-04 ENCOUNTER — Encounter: Payer: Self-pay | Admitting: Obstetrics and Gynecology

## 2017-06-04 ENCOUNTER — Ambulatory Visit (INDEPENDENT_AMBULATORY_CARE_PROVIDER_SITE_OTHER): Payer: Medicaid Other | Admitting: Obstetrics and Gynecology

## 2017-06-04 VITALS — BP 108/72 | HR 60 | Wt 188.9 lb

## 2017-06-04 DIAGNOSIS — Z30431 Encounter for routine checking of intrauterine contraceptive device: Secondary | ICD-10-CM | POA: Diagnosis not present

## 2017-06-04 NOTE — Progress Notes (Signed)
Patient is in the office for IUD string check.

## 2017-06-04 NOTE — Progress Notes (Signed)
26 yo [id number] here for IUD check s/p insertion end of May. Patient reports a normal period. She denies any pelvic pain or abnormal discharge. She has been sexually active without complaints  Past Medical History:  Diagnosis Date  . Chlamydia   . Infection    UTI  . Ovarian cyst   . Pre-eclampsia   . Pregnancy induced hypertension    with first preg   Past Surgical History:  Procedure Laterality Date  . WISDOM TOOTH EXTRACTION     Family History  Problem Relation Age of Onset  . Hypertension Mother   . Diabetes Father   . Hypertension Father   . Stroke Father   . Heart disease Father   . Kidney disease Father    Social History  Substance Use Topics  . Smoking status: Never Smoker  . Smokeless tobacco: Never Used  . Alcohol use 0.0 oz/week   ROS See pertinent in HPI  Blood pressure 108/72, pulse 60, weight 188 lb 14.4 oz (85.7 kg), not currently breastfeeding. GENERAL: Well-developed, well-nourished female in no acute distress.  ABDOMEN: Soft, nontender, nondistended.  PELVIC: Normal external female genitalia. Vagina is pink and rugated.  Normal discharge. Normal appearing cervix with IUD strings extending 2.5 cm from os. Uterus is normal in size. No adnexal mass or tenderness. EXTREMITIES: No cyanosis, clubbing, or edema, 2+ distal pulses.  A/P 26 yo here for IUD check - IUD appears to be in the appropriate location - Pap smear due November 2018 - RTC prn

## 2020-07-05 ENCOUNTER — Other Ambulatory Visit: Payer: Self-pay

## 2020-07-05 ENCOUNTER — Ambulatory Visit (HOSPITAL_COMMUNITY): Admission: EM | Admit: 2020-07-05 | Discharge: 2020-07-05 | Discharge status: Against Medical Advice | Payer: Self-pay

## 2020-10-18 ENCOUNTER — Encounter (HOSPITAL_COMMUNITY): Payer: Self-pay | Admitting: Emergency Medicine

## 2020-10-18 ENCOUNTER — Other Ambulatory Visit: Payer: Self-pay

## 2020-10-18 ENCOUNTER — Ambulatory Visit (HOSPITAL_COMMUNITY)
Admission: EM | Admit: 2020-10-18 | Discharge: 2020-10-18 | Disposition: A | Discharge status: Home / Self Care | Payer: Self-pay | Attending: Family Medicine | Admitting: Family Medicine

## 2020-10-18 DIAGNOSIS — L0291 Cutaneous abscess, unspecified: Secondary | ICD-10-CM

## 2020-10-18 DIAGNOSIS — R220 Localized swelling, mass and lump, head: Secondary | ICD-10-CM

## 2020-10-18 MED ORDER — SULFAMETHOXAZOLE-TRIMETHOPRIM 800-160 MG PO TABS: 1.0000 | ORAL_TABLET | Freq: Two times a day (BID) | ORAL | 0 refills | Status: AC

## 2020-10-18 NOTE — ED Triage Notes (Signed)
Pt c/o left under eye swelling and hardness x 2 days. Pt states she was exfoliating and the next morning woke up with swelling.

## 2020-10-18 NOTE — Discharge Instructions (Addendum)
Treating you for infection Take the antibiotics as prescribed Warm compresses to the area 2 to 3 times day for 10 minutes  Follow up as needed for continued or worsening symptoms

## 2020-10-18 NOTE — ED Provider Notes (Signed)
[facility]    CSN: [id number] Arrival date & time: 10/18/20  7622      History   Chief Complaint Chief Complaint  Patient presents with  . Facial Swelling    HPI Tina Mays is a 29 y.o. female.   Patient is a 29 year old female presents today for swelling on the left eye.  This started 2 days ago.  Started after using some exfoliate her to slit her face and the next day was having pain and swelling under the eye.  There is some erythema.  Reporting nodule that is tender.  No fevers, chills.  Low-grade fever here today.  No problems with vision or pain with movement of the eye.     Past Medical History:  Diagnosis Date  . Chlamydia   . Infection    UTI  . Ovarian cyst   . Pre-eclampsia   . Pregnancy induced hypertension    with first preg    Patient Active Problem List   Diagnosis Date Noted  . Indication for care in labor and delivery, antepartum 03/27/2017  . Acute seasonal allergic rhinitis due to pollen 03/18/2017  . Supervision of normal pregnancy, antepartum 08/05/2013    Past Surgical History:  Procedure Laterality Date  . WISDOM TOOTH EXTRACTION      OB History    Gravida  5   Para  3   Term  2   Preterm  1   AB  2   Living  3     SAB  1   TAB  0   Ectopic      Multiple  0   Live Births  3            Home Medications    Prior to Admission medications   Medication Sig Start Date End Date Taking? Authorizing Provider  sulfamethoxazole-trimethoprim (BACTRIM DS) 800-160 MG tablet Take 1 tablet by mouth 2 (two) times daily for 7 days. 10/18/20 10/25/20  Dahlia Byes A, NP  loratadine (CLARITIN) 10 MG tablet Take 1 tablet (10 mg total) by mouth daily. Patient not taking: Reported on 05/07/2017 03/18/17 10/18/20  Brock Bad, MD    Family History Family History  Problem Relation Age of Onset  . Hypertension Mother   . Diabetes Father   . Hypertension Father   . Stroke Father   . Heart disease Father     . Kidney disease Father     Social History Social History   Tobacco Use  . Smoking status: Never Smoker  . Smokeless tobacco: Never Used  Vaping Use  . Vaping Use: Never used  Substance Use Topics  . Alcohol use: Yes    Alcohol/week: 0.0 standard drinks  . Drug use: No     Allergies   Patient has no known allergies.   Review of Systems Review of Systems   Physical Exam Triage Vital Signs ED Triage Vitals  Enc Vitals Group     BP 10/18/20 1106 102/81     Pulse Rate 10/18/20 1106 72     Resp 10/18/20 1106 18     Temp 10/18/20 1106 99.1 F (37.3 C)     Temp Source 10/18/20 1106 Oral     SpO2 --      Weight --      Height --      Head Circumference --      Peak Flow --      Pain Score 10/18/20 1103 0  Pain Loc --      Pain Edu? --      Excl. in GC? --    No data found.  Updated Vital Signs BP 102/81 (BP Location: Left Arm)   Pulse 72   Temp 99.1 F (37.3 C) (Oral)   Resp 18   LMP 10/13/2020   Visual Acuity Right Eye Distance:   Left Eye Distance:   Bilateral Distance:    Right Eye Near:   Left Eye Near:    Bilateral Near:     Physical Exam Vitals and nursing note reviewed.  Constitutional:      General: She is not in acute distress.    Appearance: Normal appearance. She is not ill-appearing, toxic-appearing or diaphoretic.  HENT:     Head: Normocephalic.      Comments: Swelling, erythema and hard nodule palpated that is movable. TTP    Nose: Nose normal.  Eyes:     Extraocular Movements: Extraocular movements intact.     Conjunctiva/sclera: Conjunctivae normal.     Pupils: Pupils are equal, round, and reactive to light.  Pulmonary:     Effort: Pulmonary effort is normal.  Musculoskeletal:        General: Normal range of motion.     Cervical back: Normal range of motion.  Skin:    General: Skin is warm and dry.     Findings: No rash.  Neurological:     Mental Status: She is alert.  Psychiatric:        Mood and Affect: Mood  normal.      UC Treatments / Results  Labs (all labs ordered are listed, but only abnormal results are displayed) Labs Reviewed - No data to display  EKG   Radiology No results found.  Procedures Procedures (including critical care time)  Medications Ordered in UC Medications - No data to display  Initial Impression / Assessment and Plan / UC Course  I have reviewed the triage vital signs and the nursing notes.  Pertinent labs & imaging results that were available during my care of the patient were reviewed by me and considered in my medical decision making (see chart for details).     Facial swelling, abscess Treating for infection.  Bactrim to treat.  Warm compresses to the area.  Follow up as needed for continued or worsening symptoms  Final Clinical Impressions(s) / UC Diagnoses   Final diagnoses:  Facial swelling  Abscess     Discharge Instructions     Treating you for infection Take the antibiotics as prescribed Warm compresses to the area 2 to 3 times day for 10 minutes  Follow up as needed for continued or worsening symptoms     ED Prescriptions    Medication Sig Dispense Auth. Provider   sulfamethoxazole-trimethoprim (BACTRIM DS) 800-160 MG tablet Take 1 tablet by mouth 2 (two) times daily for 7 days. 14 tablet Caysen Whang A, NP     PDMP not reviewed this encounter.   Janace Aris, NP 10/18/20 1438

## 2021-09-30 ENCOUNTER — Emergency Department (HOSPITAL_COMMUNITY): Admission: EM | Admit: 2021-09-30 | Discharge: 2021-10-01 | Discharge status: Against Medical Advice | Payer: Self-pay

## 2021-09-30 NOTE — ED Notes (Signed)
Patient left on own accord °

## 2022-05-08 ENCOUNTER — Telehealth: Payer: Self-pay

## 2022-05-08 NOTE — Telephone Encounter (Signed)
The patient was called to confirm her upcoming appointment.  A voicemail was left for the patient to contact the office to confirm the appointment.

## 2022-05-12 ENCOUNTER — Ambulatory Visit (INDEPENDENT_AMBULATORY_CARE_PROVIDER_SITE_OTHER): Payer: Self-pay | Admitting: Obstetrics and Gynecology

## 2022-05-12 ENCOUNTER — Encounter: Payer: Self-pay | Admitting: Obstetrics and Gynecology

## 2022-05-12 VITALS — BP 121/79 | HR 64 | Ht 63.0 in | Wt 186.8 lb

## 2022-05-12 DIAGNOSIS — Z30015 Encounter for initial prescription of vaginal ring hormonal contraceptive: Secondary | ICD-10-CM

## 2022-05-12 DIAGNOSIS — Z30432 Encounter for removal of intrauterine contraceptive device: Secondary | ICD-10-CM | POA: Insufficient documentation

## 2022-05-12 MED ORDER — ETONOGESTREL-ETHINYL ESTRADIOL 0.12-0.015 MG/24HR VA RING: VAGINAL_RING | VAGINAL | 1 refills | Status: DC

## 2022-05-12 NOTE — Progress Notes (Signed)
Pt presents for IUD removal. Pt does not desire any replacement birth control at this time. 0

## 2022-05-12 NOTE — Progress Notes (Signed)
   IUD Removal Procedure Note   Patient is 31 y.o. [id number] who is here for Mirena IUD removal. She would like IUD removed secondary to time for removal. She has had no issues with IUD. She understands that she could get pregnant after removal of IUD if she does not use another form of contraception. She has no other complaints today. Reviewed risks of removal including pain, bleeding, difficult removal and inability to remove IUD which may require surgical removal in OR. She affirms that she would like IUD removed.  BP 121/79   Pulse 64   Ht 5\' 3"  (1.6 m)   Wt 186 lb 12.8 oz (84.7 kg)   BMI 33.09 kg/m   Patient with normal appearing external female genitalia. Graves speculum placed in vagina and were not IUD strings easily visualized. Cytobrush used to easily retrieve strings from cervical os. Strings grasped with ring forceps and removed easily. Minimal bleeding noted. All instruments removed from vagina. Patient tolerated procedure very well.    She was given post removal instructions. She is planning on using Plan B and considering trying Nuvaring for contraception. Rx for Nuvaring printed and given to patient. Advised to use GoodRx app for discounted price on Nuvaring device.  Overdue for annual with pap. Patient is self-pay - message sent to [facility] for scheduling with them.    Laury Deep, MSN, CNM 05/12/2022 11:08 AM

## 2022-05-12 NOTE — Patient Instructions (Addendum)
Please download GoodRx app on your phone for medication discounts.   Etonogestrel; Ethinyl Estradiol Vaginal Ring What is this medication? ETONOGESTREL; ETHINYL ESTRADIOL (et oh noe JES trel; ETH in il es tra DYE ole) prevents ovulation and pregnancy. It belongs to a group of medications called contraceptives. It is a combination of the hormones estrogen and progestin. This medicine may be used for other purposes; ask your health care provider or pharmacist if you have questions. COMMON BRAND NAME(S): EluRyng, HALOETTE, NuvaRing What should I tell my care team before I take this medication? They need to know if you have any of these conditions: Abnormal vaginal bleeding Blood vessel disease or blood clots Breast, cervical, endometrial, ovarian, liver, or uterine cancer Diabetes (high blood sugar) Gallbladder disease Having surgery Heart disease or recent heart attack High blood pressure High cholesterol or triglycerides History of irregular heartbeat or heart valve problems Kidney disease Liver disease Migraine headaches Protein C/S deficiency Recently had a baby, miscarriage, or abortion Stroke Systemic lupus erythematosus (SLE) Tobacco smoker An unusual or allergic reaction to estrogens, progestins, other medications, foods, dyes, or preservatives Pregnant or trying to get pregnant Breast-feeding How should I use this medication? Insert the ring into your vagina as directed. Follow the directions on the prescription label. The ring will remain place for 3 weeks and is then removed for a 1-week break. A new ring is inserted 1 week after the last ring was removed, on the same day of the week. Check often to make sure the ring is still in place. If the ring was out of the vagina for an unknown amount of time, you may not be protected from pregnancy. Perform a pregnancy test and call your care team. Do not use more often than directed. A patient package insert for the product will be  given with each prescription and refill. Read this sheet carefully each time. The sheet may change frequently. Contact your care team regarding the use of this medication in children. Special care may be needed. Overdosage: If you think you have taken too much of this medicine contact a poison control center or emergency room at once. NOTE: This medicine is only for you. Do not share this medicine with others. What if I miss a dose? You will need to use the ring exactly as directed. It is very important to follow the schedule every cycle. If you do not use the ring as directed, you may not be protected from pregnancy. If the ring should slip out, is lost, or if you leave it in longer or shorter than you should, contact your care team for advice. What may interact with this medication? Do not take this medication with the following: Dasabuvir; ombitasvir; paritaprevir; ritonavir Ombitasvir; paritaprevir; ritonavir Vaginal lubricants or other vaginal products that are oil-based or silicone-based This medication may also interact with the following: Acetaminophen Antibiotics or medications for infections, especially rifampin, rifabutin, rifapentine, and griseofulvin, and possibly penicillins or tetracyclines Aprepitant or fosaprepitant Armodafinil Ascorbic acid (vitamin C) Barbiturate medications, such as phenobarbital or primidone Bosentan Certain antiviral medications for hepatitis, HIV or AIDS Certain medications for cancer treatment Certain medications for seizures like carbamazepine, clobazam, felbamate, lamotrigine, oxcarbazepine, phenytoin, rufinamide, topiramate Certain medications for treating high cholesterol Cyclosporine Dantrolene Elagolix Flibanserin Grapefruit juice Lesinurad Medications for diabetes Medications to treat fungal infections, such as griseofulvin, miconazole, fluconazole, ketoconazole, itraconazole, posaconazole or  voriconazole Mifepristone Mitotane Modafinil Morphine Mycophenolate St. John's wort Tamoxifen Temazepam Theophylline or aminophylline Thyroid hormones Tizanidine Tranexamic acid Ulipristal  Warfarin This list may not describe all possible interactions. Give your health care provider a list of all the medicines, herbs, non-prescription drugs, or dietary supplements you use. Also tell them if you smoke, drink alcohol, or use illegal drugs. Some items may interact with your medicine. What should I watch for while using this medication? Visit your care team for regular checks on your progress. You will need a regular breast and pelvic exam and Pap smear while on this medication. Check with your care team to see if you need an additional method of contraception during the first cycle that you use this ring. Female condoms (made with natural rubber latex, polyisoprene, and polyurethane) and spermicides may be used. Do not use a diaphragm, cervical cap, or a female condom, as the ring can interfere with these birth control methods and their proper placement. If you have any reason to think you are pregnant, stop using this medication right away and contact your care team. If you are using this medication for hormone related problems, it may take several cycles of use to see improvement in your condition. Smoking increases the risk of getting a blood clot or having a stroke while you are using hormonal birth control, especially if you are older than 32 years old. You are strongly advised not to smoke. Some women are prone to getting dark patches on the skin of the face (cholasma). Your risk of getting chloasma with this medication is higher if you had chloasma during a pregnancy. Keep out of the sun. If you cannot avoid being in the sun, wear protective clothing and use sunscreen. Do not use sun lamps or tanning beds/booths. This medication can make your body retain fluid, making your fingers, hands, or  ankles swell. Your blood pressure can go up. Contact your care team if you feel you are retaining fluid. If you are going to have elective surgery, you may need to stop using this medication before the surgery. Consult your care team for advice. This medication does not protect you against HIV infection (AIDS) or any other sexually transmitted infections. What side effects may I notice from receiving this medication? Side effects that you should report to your care team as soon as possible: Allergic reactions--skin rash, itching, hives, swelling of the face, lips, tongue, or throat Blood clot--pain, swelling, or warmth in the leg, shortness of breath, chest pain Gallbladder problems--severe stomach pain, nausea, vomiting, fever Increase in blood pressure Liver injury--right upper belly pain, loss of appetite, nausea, light-colored stool, dark yellow or brown urine, yellowing skin or eyes, unusual weakness or fatigue New or worsening migraines or headaches Stroke--sudden numbness or weakness of the face, arm, or leg, trouble speaking, confusion, trouble walking, loss of balance or coordination, dizziness, severe headache, change in vision Toxic shock syndrome--fever, headache, general discomfort and fatigue, vomiting, diarrhea, rash or peeling of the skin over hands or feet Unusual vaginal discharge, itching, or odor Vaginal pain, irritation, or sores Worsening mood, feelings of depression Side effects that usually do not require medical attention (report to your care team if they continue or are bothersome): Breast pain or tenderness Dark patches of skin on the face or other sun-exposed areas Irregular menstrual cycles or spotting Nausea Weight gain This list may not describe all possible side effects. Call your doctor for medical advice about side effects. You may report side effects to FDA at 1-800-FDA-1088. Where should I keep my medication? Keep out of the reach of children and  pets. Store unopened  medication for up to 4 months at room temperature at 15 and 30 degrees C (59 and 86 degrees F). Protect from light. Do not store above 30 degrees C (86 degrees F). Throw away any unused medication 4 months after the dispense date or the expiration date, whichever comes first. A ring may only be used for 1 cycle (1 month). After the 3-week cycle, a used ring is removed and should be placed in the re-closable foil pouch and discarded in the trash out of reach of children and pets. Do NOT flush down the toilet. NOTE: This sheet is a summary. It may not cover all possible information. If you have questions about this medicine, talk to your doctor, pharmacist, or health care provider.  2023 Elsevier/Gold Standard (2021-10-04 00:00:00)

## 2022-07-29 ENCOUNTER — Ambulatory Visit: Payer: Self-pay

## 2022-09-10 ENCOUNTER — Encounter: Payer: Self-pay | Admitting: Obstetrics and Gynecology

## 2022-09-10 ENCOUNTER — Ambulatory Visit (INDEPENDENT_AMBULATORY_CARE_PROVIDER_SITE_OTHER): Payer: No Typology Code available for payment source | Admitting: Obstetrics and Gynecology

## 2022-09-10 ENCOUNTER — Other Ambulatory Visit (HOSPITAL_COMMUNITY)
Admission: RE | Admit: 2022-09-10 | Discharge: 2022-09-10 | Disposition: A | Discharge status: Home / Self Care | Payer: No Typology Code available for payment source | Source: Ambulatory Visit | Attending: Obstetrics and Gynecology | Admitting: Obstetrics and Gynecology

## 2022-09-10 DIAGNOSIS — Z202 Contact with and (suspected) exposure to infections with a predominantly sexual mode of transmission: Secondary | ICD-10-CM | POA: Diagnosis not present

## 2022-09-10 DIAGNOSIS — Z01419 Encounter for gynecological examination (general) (routine) without abnormal findings: Secondary | ICD-10-CM | POA: Diagnosis not present

## 2022-09-10 NOTE — Progress Notes (Signed)
Patient presents for AEX. Last Pap: 10/28/2016 - normal Last Mammogram: No breast concerns Contraception: None, does not desire. Vaginal/Urinary Symptoms: Denies  STD Screen: Desires vaginal swab, desires bloodwork Other Concerns:

## 2022-09-10 NOTE — Progress Notes (Signed)
Tina Mays is a 31 y.o. [phone] female here for a routine annual gynecologic exam.  Current complaints: Desires STD testing.   Denies abnormal vaginal bleeding, discharge, pelvic pain, problems with intercourse or other gynecologic concerns.    Gynecologic History Patient's last menstrual period was 08/30/2022 (exact date). Contraception: none Last Pap: 2017. Results were: normal Last mammogram: NA.   Obstetric History OB History  Gravida Para Term Preterm AB Living  5 3 2 1 2 3   SAB IAB Ectopic Multiple Live Births  1 0   0 3    # Outcome Date GA Lbr Len/2nd Weight Sex Delivery Anes PTL Lv  5 Preterm 03/28/17 [redacted]w[redacted]d 08:52 / 00:07 5 lb 15.8 oz (2.715 kg) M Vag-Spont EPI  LIV  4 Term 11/14/13 [redacted]w[redacted]d 12:27 / 00:15 6 lb 12.5 oz (3.075 kg) M Vag-Spont EPI  LIV  3 AB 06/07/09 [redacted]w[redacted]d         2 Term 12/08/08 [redacted]w[redacted]d  6 lb 8 oz (2.948 kg) F Vag-Spont EPI  LIV     Birth Comments: Pre-eclampsia.  Induced around 38 weeks.  1 SAB 05/09/07 [redacted]w[redacted]d           Past Medical History:  Diagnosis Date   Chlamydia    Infection    UTI   Ovarian cyst    Pre-eclampsia    Pregnancy induced hypertension    with first preg    Past Surgical History:  Procedure Laterality Date   WISDOM TOOTH EXTRACTION      Current Outpatient Medications on File Prior to Visit  Medication Sig Dispense Refill   [DISCONTINUED] loratadine (CLARITIN) 10 MG tablet Take 1 tablet (10 mg total) by mouth daily. (Patient not taking: Reported on 05/07/2017) 30 tablet 11   No current facility-administered medications on file prior to visit.    No Known Allergies  Social History   Socioeconomic History   Marital status: Single    Spouse name: Not on file   Number of children: Not on file   Years of education: Not on file   Highest education level: Not on file  Occupational History   Not on file  Tobacco Use   Smoking status: Never   Smokeless tobacco: Never  Vaping Use   Vaping Use: Never used  Substance and  Sexual Activity   Alcohol use: Yes    Comment: occ   Drug use: Yes    Types: Marijuana   Sexual activity: Yes    Partners: Male    Birth control/protection: I.U.D.  Other Topics Concern   Not on file  Social History Narrative   Not on file   Social Determinants of Health   Financial Resource Strain: Not on file  Food Insecurity: Not on file  Transportation Needs: Not on file  Physical Activity: Not on file  Stress: Not on file  Social Connections: Not on file  Intimate Partner Violence: Not on file    Family History  Problem Relation Age of Onset   Hypertension Mother    Diabetes Father    Hypertension Father    Stroke Father    Heart disease Father    Kidney disease Father     The following portions of the patient's history were reviewed and updated as appropriate: allergies, current medications, past family history, past medical history, past social history, past surgical history and problem list.  Review of Systems Pertinent items noted in HPI and remainder of comprehensive ROS otherwise negative.   Objective:  BP Marland Kitchen)  139/97   Pulse 90   Ht 5\' 4"  (1.626 m)   Wt 191 lb 8 oz (86.9 kg)   LMP 08/30/2022 (Exact Date)   BMI 32.87 kg/m  Chaperone present CONSTITUTIONAL: Well-developed, well-nourished female in no acute distress.  HENT:  Normocephalic, atraumatic, External right and left ear normal. Oropharynx is clear and moist EYES: Conjunctivae and EOM are normal. Pupils are equal, round, and reactive to light. No scleral icterus.  NECK: Normal range of motion, supple, no masses.  Normal thyroid.  SKIN: Skin is warm and dry. No rash noted. Not diaphoretic. No erythema. No pallor. NEUROLGIC: Alert and oriented to person, place, and time. Normal reflexes, muscle tone coordination. No cranial nerve deficit noted. PSYCHIATRIC: Normal mood and affect. Normal behavior. Normal judgment and thought content. CARDIOVASCULAR: Normal heart rate noted, regular  rhythm RESPIRATORY: Clear to auscultation bilaterally. Effort and breath sounds normal, no problems with respiration noted. BREASTS: Symmetric in size. No masses, skin changes, nipple drainage, or lymphadenopathy. ABDOMEN: Soft, normal bowel sounds, no distention noted.  No tenderness, rebound or guarding.  PELVIC: Normal appearing external genitalia; normal appearing vaginal mucosa and cervix.  No abnormal discharge noted.  Pap smear obtained.  Normal uterine size, no other palpable masses, no uterine or adnexal tenderness. MUSCULOSKELETAL: Normal range of motion. No tenderness.  No cyanosis, clubbing, or edema.  2+ distal pulses.   Assessment:  Annual gynecologic examination with pap smear  STD exposure Plan:  Will follow up results of pap smear and manage accordingly. STD testing as per pt request Declines contraception Routine preventative health maintenance measures emphasized. Please refer to After Visit Summary for other counseling recommendations.    09/01/2022, MD, FACOG Attending Obstetrician & Gynecologist [facility], [facility]

## 2022-09-10 NOTE — Patient Instructions (Signed)

## 2022-09-11 ENCOUNTER — Telehealth: Payer: Self-pay

## 2022-09-11 LAB — CERVICOVAGINAL ANCILLARY ONLY
Bacterial Vaginitis (gardnerella): POSITIVE — AB
Candida Glabrata: NEGATIVE
Candida Vaginitis: NEGATIVE
Chlamydia: NEGATIVE
Comment: NEGATIVE
Comment: NEGATIVE
Comment: NEGATIVE
Comment: NEGATIVE
Comment: NEGATIVE
Comment: NORMAL
Neisseria Gonorrhea: NEGATIVE
Trichomonas: NEGATIVE

## 2022-09-11 LAB — HEPATITIS B SURFACE ANTIGEN: Hepatitis B Surface Ag: NEGATIVE

## 2022-09-11 LAB — HIV ANTIBODY (ROUTINE TESTING W REFLEX): HIV Screen 4th Generation wRfx: NONREACTIVE

## 2022-09-11 LAB — RPR: RPR Ser Ql: NONREACTIVE

## 2022-09-11 LAB — HEPATITIS C ANTIBODY: Hep C Virus Ab: NONREACTIVE

## 2022-09-11 MED ORDER — METRONIDAZOLE 500 MG PO TABS: 500.0000 mg | ORAL_TABLET | Freq: Two times a day (BID) | ORAL | 0 refills | Status: DC

## 2022-09-11 NOTE — Telephone Encounter (Signed)
Advised of results and rx sent per protocol 

## 2022-09-12 LAB — CYTOLOGY - PAP
Comment: NEGATIVE
Diagnosis: NEGATIVE
High risk HPV: NEGATIVE

## 2022-11-14 ENCOUNTER — Encounter (HOSPITAL_COMMUNITY): Payer: Self-pay | Admitting: Emergency Medicine

## 2022-11-14 ENCOUNTER — Ambulatory Visit (HOSPITAL_COMMUNITY)
Admission: EM | Admit: 2022-11-14 | Discharge: 2022-11-14 | Disposition: A | Discharge status: Home / Self Care | Payer: Self-pay | Attending: Family Medicine | Admitting: Family Medicine

## 2022-11-14 DIAGNOSIS — S161XXA Strain of muscle, fascia and tendon at neck level, initial encounter: Secondary | ICD-10-CM

## 2022-11-14 MED ORDER — TIZANIDINE HCL 4 MG PO TABS: 4.0000 mg | ORAL_TABLET | Freq: Four times a day (QID) | ORAL | 0 refills | Status: DC | PRN
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
## 2022-12-31 ENCOUNTER — Ambulatory Visit
Admission: EM | Admit: 2022-12-31 | Discharge: 2022-12-31 | Disposition: A | Discharge status: Home / Self Care | Payer: No Typology Code available for payment source | Attending: Internal Medicine | Admitting: Internal Medicine

## 2022-12-31 DIAGNOSIS — J029 Acute pharyngitis, unspecified: Secondary | ICD-10-CM | POA: Insufficient documentation

## 2022-12-31 DIAGNOSIS — J069 Acute upper respiratory infection, unspecified: Secondary | ICD-10-CM | POA: Insufficient documentation

## 2022-12-31 DIAGNOSIS — Z1152 Encounter for screening for COVID-19: Secondary | ICD-10-CM | POA: Diagnosis not present

## 2022-12-31 LAB — POCT INFLUENZA A/B
Influenza A, POC: NEGATIVE
Influenza B, POC: NEGATIVE

## 2022-12-31 LAB — POCT RAPID STREP A (OFFICE): Rapid Strep A Screen: NEGATIVE

## 2022-12-31 MED ORDER — ACETAMINOPHEN 325 MG PO TABS
650.0000 mg | ORAL_TABLET | Freq: Once | ORAL | Status: AC
  Administered 2022-12-31: 650 mg via ORAL

## 2022-12-31 MED ORDER — AMOXICILLIN 500 MG PO CAPS: 500.0000 mg | ORAL_CAPSULE | Freq: Three times a day (TID) | ORAL | 0 refills | Status: DC

## 2022-12-31 NOTE — ED Provider Notes (Signed)
Tina Mays    CSN: [id number] Arrival date & time: 12/31/22  1612      History   Chief Complaint Chief Complaint  Patient presents with   Chills    HPI Tina Mays is a 32 y.o. female.   Patient here today for fever and chills that started 2 days ago. She has also had sore throat and congestion. She has tried OTC meds without resolution. She denies any vomiting or diarrhea.   The history is provided by the patient.    Past Medical History:  Diagnosis Date   Chlamydia    Infection    UTI   Ovarian cyst    Pre-eclampsia    Pregnancy induced hypertension    with first preg    Patient Active Problem List   Diagnosis Date Noted   Gynecologic exam normal 09/10/2022   Possible exposure to STD 09/10/2022    Past Surgical History:  Procedure Laterality Date   WISDOM TOOTH EXTRACTION      OB History     Gravida  5   Para  3   Term  2   Preterm  1   AB  2   Living  3      SAB  1   IAB  0   Ectopic      Multiple  0   Live Births  3            Home Medications    Prior to Admission medications   Medication Sig Start Date End Date Taking? Authorizing Provider  amoxicillin (AMOXIL) 500 MG capsule Take 1 capsule (500 mg total) by mouth 3 (three) times daily. 12/31/22  Yes Francene Finders, PA-C  loratadine (CLARITIN) 10 MG tablet Take 1 tablet (10 mg total) by mouth daily. Patient not taking: Reported on 05/07/2017 03/18/17 10/18/20  Shelly Bombard, MD    Family History Family History  Problem Relation Age of Onset   Hypertension Mother    Diabetes Father    Hypertension Father    Stroke Father    Heart disease Father    Kidney disease Father     Social History Social History   Tobacco Use   Smoking status: Never   Smokeless tobacco: Never  Vaping Use   Vaping Use: Never used  Substance Use Topics   Alcohol use: Yes    Comment: occ   Drug use: Yes    Types: Marijuana     Allergies   Patient has no  known allergies.   Review of Systems Review of Systems  Constitutional:  Positive for chills and fever.  HENT:  Positive for congestion and sore throat. Negative for ear pain.   Eyes:  Negative for discharge and redness.  Respiratory:  Positive for cough. Negative for shortness of breath and wheezing.   Gastrointestinal:  Negative for abdominal pain, diarrhea, nausea and vomiting.     Physical Exam Triage Vital Signs ED Triage Vitals  Enc Vitals Group     BP 12/31/22 1827 126/76     Pulse Rate 12/31/22 1827 92     Resp 12/31/22 1827 16     Temp 12/31/22 1827 (!) 102.3 F (39.1 C)     Temp Source 12/31/22 1827 Oral     SpO2 12/31/22 1827 100 %     Weight --      Height --      Head Circumference --      Peak Flow --  Pain Score 12/31/22 1828 7     Pain Loc --      Pain Edu? --      Excl. in [place]? --    No data found.  Updated Vital Signs BP 126/76 (BP Location: Left Arm)   Pulse 92   Temp (!) 102.3 F (39.1 C) (Oral)   Resp 16   SpO2 100%       Physical Exam Vitals and nursing note reviewed.  Constitutional:      General: She is not in acute distress.    Appearance: Normal appearance. She is not ill-appearing.  HENT:     Head: Normocephalic and atraumatic.     Right Ear: Tympanic membrane normal.     Left Ear: Tympanic membrane normal.     Nose: Congestion present.     Mouth/Throat:     Mouth: Mucous membranes are moist.     Pharynx: Posterior oropharyngeal erythema present. No oropharyngeal exudate.  Eyes:     Conjunctiva/sclera: Conjunctivae normal.  Cardiovascular:     Rate and Rhythm: Normal rate and regular rhythm.     Heart sounds: Normal heart sounds. No murmur heard. Pulmonary:     Effort: Pulmonary effort is normal. No respiratory distress.     Breath sounds: Normal breath sounds. No wheezing, rhonchi or rales.  Skin:    General: Skin is warm and dry.  Neurological:     Mental Status: She is alert.  Psychiatric:        Mood and Affect:  Mood normal.        Thought Content: Thought content normal.      UC Treatments / Results  Labs (all labs ordered are listed, but only abnormal results are displayed) Labs Reviewed  SARS CORONAVIRUS 2 (TAT 6-24 HRS)  CULTURE, GROUP A STREP [facility])  POCT INFLUENZA A/B  POCT RAPID STREP A (OFFICE)    EKG   Radiology No results found.  Procedures Procedures (including critical Mays time)  Medications Ordered in UC Medications  acetaminophen (TYLENOL) tablet 650 mg (650 mg Oral Given 12/31/22 1829)    Initial Impression / Assessment and Plan / UC Course  I have reviewed the triage vital signs and the nursing notes.  Pertinent labs & imaging results that were available during my Mays of the patient were reviewed by me and considered in my medical decision making (see chart for details).    Strep and flu screening negative. Will order covid screen. Given appearance of tonsils and fever will treat with antibiotics and throat culture ordered. Recommended symptomatic treatment as needed and follow up if no gradual improvement or with any further concerns.   Final Clinical Impressions(s) / UC Diagnoses   Final diagnoses:  Acute pharyngitis, unspecified etiology  Acute upper respiratory infection  Encounter for screening for COVID-19     Discharge Instructions       Flu and rapid strep test negative.   Will order throat culture and covid screening.   Amoxicillin prescribed.   Recommend symptomatic treatment, increased fluids and rest with follow up if no gradual improvement or with any further concerns.      ED Prescriptions     Medication Sig Dispense Auth. Provider   amoxicillin (AMOXIL) 500 MG capsule Take 1 capsule (500 mg total) by mouth 3 (three) times daily. 21 capsule Francene Finders, PA-C      PDMP not reviewed this encounter.   Francene Finders, PA-C 01/01/23 1134

## 2022-12-31 NOTE — ED Triage Notes (Signed)
Pt c/o body chills, fever, nasal congestion,   Onset ~ 2 days ago

## 2022-12-31 NOTE — Discharge Instructions (Signed)
  Flu and rapid strep test negative.   Will order throat culture and covid screening.   Amoxicillin prescribed.   Recommend symptomatic treatment, increased fluids and rest with follow up if no gradual improvement or with any further concerns.

## 2023-01-01 LAB — SARS CORONAVIRUS 2 (TAT 6-24 HRS): SARS Coronavirus 2: NEGATIVE

## 2023-01-03 LAB — CULTURE, GROUP A STREP (THRC)

## 2023-12-07 ENCOUNTER — Ambulatory Visit: Payer: No Typology Code available for payment source | Admitting: Obstetrics

## 2025-01-12 ENCOUNTER — Other Ambulatory Visit (HOSPITAL_COMMUNITY)
Admission: RE | Admit: 2025-01-12 | Discharge: 2025-01-12 | Disposition: A | Source: Ambulatory Visit | Attending: Family Medicine | Admitting: Family Medicine

## 2025-01-12 ENCOUNTER — Encounter: Payer: Self-pay | Admitting: Family Medicine

## 2025-01-12 ENCOUNTER — Ambulatory Visit: Admitting: Family Medicine

## 2025-01-12 VITALS — BP 110/74 | HR 59 | Ht 64.0 in | Wt 204.6 lb

## 2025-01-12 DIAGNOSIS — N912 Amenorrhea, unspecified: Secondary | ICD-10-CM

## 2025-01-12 DIAGNOSIS — Z13220 Encounter for screening for lipoid disorders: Secondary | ICD-10-CM

## 2025-01-12 DIAGNOSIS — E559 Vitamin D deficiency, unspecified: Secondary | ICD-10-CM | POA: Insufficient documentation

## 2025-01-12 DIAGNOSIS — Z803 Family history of malignant neoplasm of breast: Secondary | ICD-10-CM | POA: Insufficient documentation

## 2025-01-12 DIAGNOSIS — J301 Allergic rhinitis due to pollen: Secondary | ICD-10-CM

## 2025-01-12 DIAGNOSIS — Z202 Contact with and (suspected) exposure to infections with a predominantly sexual mode of transmission: Secondary | ICD-10-CM

## 2025-01-12 LAB — LIPID PANEL
Cholesterol: 111 mg/dL (ref 28–200)
HDL: 40.4 mg/dL
LDL Cholesterol: 58 mg/dL (ref 10–99)
NonHDL: 70.81
Total CHOL/HDL Ratio: 3
Triglycerides: 66 mg/dL (ref 10.0–149.0)
VLDL: 13.2 mg/dL (ref 0.0–40.0)

## 2025-01-12 LAB — COMPREHENSIVE METABOLIC PANEL WITH GFR
ALT: 12 U/L (ref 3–35)
AST: 17 U/L (ref 5–37)
Albumin: 4.6 g/dL (ref 3.5–5.2)
Alkaline Phosphatase: 76 U/L (ref 39–117)
BUN: 12 mg/dL (ref 6–23)
CO2: 29 meq/L (ref 19–32)
Calcium: 9.4 mg/dL (ref 8.4–10.5)
Chloride: 105 meq/L (ref 96–112)
Creatinine, Ser: 0.92 mg/dL (ref 0.40–1.20)
GFR: 81.01 mL/min
Glucose, Bld: 95 mg/dL (ref 70–99)
Potassium: 3.6 meq/L (ref 3.5–5.1)
Sodium: 141 meq/L (ref 135–145)
Total Bilirubin: 0.7 mg/dL (ref 0.2–1.2)
Total Protein: 8.2 g/dL (ref 6.0–8.3)

## 2025-01-12 LAB — VITAMIN D 25 HYDROXY (VIT D DEFICIENCY, FRACTURES): VITD: 21.01 ng/mL — ABNORMAL LOW (ref 30.00–100.00)

## 2025-01-12 LAB — CBC WITH DIFFERENTIAL/PLATELET
Basophils Absolute: 0.1 10*3/uL (ref 0.0–0.1)
Basophils Relative: 1.2 % (ref 0.0–3.0)
Eosinophils Absolute: 0.1 10*3/uL (ref 0.0–0.7)
Eosinophils Relative: 1.3 % (ref 0.0–5.0)
HCT: 40.3 % (ref 36.0–46.0)
Hemoglobin: 13.6 g/dL (ref 12.0–15.0)
Lymphocytes Relative: 32.7 % (ref 12.0–46.0)
Lymphs Abs: 1.4 10*3/uL (ref 0.7–4.0)
MCHC: 33.8 g/dL (ref 30.0–36.0)
MCV: 86.9 fl (ref 78.0–100.0)
Monocytes Absolute: 0.4 10*3/uL (ref 0.1–1.0)
Monocytes Relative: 9.3 % (ref 3.0–12.0)
Neutro Abs: 2.4 10*3/uL (ref 1.4–7.7)
Neutrophils Relative %: 55.5 % (ref 43.0–77.0)
Platelets: 296 10*3/uL (ref 150.0–400.0)
RBC: 4.64 Mil/uL (ref 3.87–5.11)
RDW: 13 % (ref 11.5–15.5)
WBC: 4.4 10*3/uL (ref 4.0–10.5)

## 2025-01-12 LAB — TSH: TSH: 1 u[IU]/mL (ref 0.35–5.50)

## 2025-01-12 NOTE — Progress Notes (Signed)
 "    Patient Care Team: Prentiss Frieze, DO as PCP - General (Family Medicine)  Diagnoses and Orders:   1. Amenorrhea   2. Acute seasonal allergic rhinitis due to pollen   3. Vitamin D  deficiency   4. Possible exposure to STD   5. Screening for lipid disorders   6. Family history of breast cancer in sister, Dx around age 35, Tx with chemo and double mastectomy, also had subsequent hysterectomy    Orders Placed This Encounter  Procedures   CBC with Differential/Platelet   Comprehensive metabolic panel with GFR   Lipid panel   TSH   VITAMIN D  25 Hydroxy (Vit-D Deficiency, Fractures)   Beta hCG quant (ref lab)   RPR W/RFLX TO RPR TITER, TREPONEMAL AB, SCREEN AND DIAGNOSIS   HIV Antibody (routine testing w rflx)   Hepatitis B surface antigen   Consult to genetic counseling   Assessment & Plan:   Assessment & Plan Woman's Wellness Visit Routine wellness visit with no significant medical history. Family history of breast cancer in sister diagnosed at age 98. No personal history of abnormal Pap smears or significant gynecological issues. No regular cholesterol or blood sugar testing. Occasional marijuana use and social alcohol consumption. Occasional exercise, more active in spring and summer. No smoking. No current symptoms of concern. - Ordered baseline labs including blood pregnancy test - Ordered mammogram due to family history of breast cancer - Discussed [facility] program for free DNA testing for cancer risk - Encouraged annual physical exams - Discussed potential use of copper IUD for contraception  Amenorrhea Past couple of months. Negative home pregnancy tests. Regular periods prior to recent irregularity. Possible disruption due to Plan B  use in December. No other significant changes in lifestyle or health status. No symptoms of vaginal discharge or dyspareunia. - Ordered blood pregnancy test to confirm negative result - Discussed potential impact of Plan B  on menstrual  cycle - Consider alternative contraception methods if Plan B  use increases  Subjective:  Discussed the use of AI scribe software for clinical note transcription with the patient, who gave verbal consent to proceed.  History of Present Illness Tina Mays is a 34 year old female who presents with amenorrhea for a couple of months.  Amenorrhea and menstrual irregularity - Amenorrhea for a couple of months - Two home pregnancy tests negative - Not actively trying to conceive - Uses Plan B  and condoms for contraception - Last use of Plan B  in mid-December 2025 - Menstrual cycles were irregular for about a year after stopping birth control, but have been mostly regular for the past two years - Menstrual cycle disrupted after taking Plan B , with period starting mid-month instead of at the beginning  Gynecologic history - History of IUD use, removed in 2023 due to emotional side effects attributed to hormones - Emotional state improved after IUD removal - No abnormal Pap smears - Last gynecologic visit in October 2023 with normal labs - No vaginal discharge - No dyspareunia - No history of mammogram  Review of Systems: Negative, with the exception of above mentioned in HPI.  History:   Reviewed by clinician on day of visit: allergies, medications, problem list, medical history, surgical history, family history, social history, and previous encounter notes.  Medications:   Show/hide medication list[1] Allergies[2]  Physical Exam:   BP 110/74   Pulse (!) 59   Ht 5' 4 (1.626 m)   Wt 204 lb 9.6 oz (92.8 kg)   LMP 11/07/2024 (  Approximate)   SpO2 98%   BMI 35.12 kg/m   Physical Exam Constitutional:      General: She is not in acute distress.    Appearance: She is well-developed.  HENT:     Head: Normocephalic and atraumatic.  Eyes:     Conjunctiva/sclera: Conjunctivae normal.  Cardiovascular:     Rate and Rhythm: Normal rate and regular rhythm.     Heart sounds:  Normal heart sounds.  Pulmonary:     Effort: Pulmonary effort is normal.     Breath sounds: Normal breath sounds.  Neurological:     General: No focal deficit present.     Mental Status: She is alert.  Psychiatric:        Behavior: Behavior normal.    Laboratory and Imaging:       Screening:   01/12/2025    PHQ2-9 Depression Screening   Little interest or pleasure in doing things Several days  Feeling down, depressed, or hopeless Not at all  PHQ-2 - Total Score 1  Trouble falling or staying asleep, or sleeping too much Nearly every day  Feeling tired or having little energy More than half the days  Poor appetite or overeating  More than half the days  Feeling bad about yourself - or that you are a failure or have let yourself or your family down Several days  Trouble concentrating on things, such as reading the newspaper or watching television More than half the days  Moving or speaking so slowly that other people could have noticed.  Or the opposite - being so fidgety or restless that you have been moving around a lot more than usual Not at all  Thoughts that you would be better off dead, or hurting yourself in some way Not at all  PHQ2-9 Total Score 11  If you checked off any problems, how difficult have these problems made it for you to do your work, take care of things at home, or get along with other people Not difficult at all  Depression Interventions/Treatment         01/12/2025    9:33 AM 09/10/2022    1:20 PM  GAD 7 : Generalized Anxiety Score  Nervous, Anxious, on Edge 0 0   Control/stop worrying 0 0   Worry too much - different things 0 0   Trouble relaxing 0 0   Restless 0 0   Easily annoyed or irritable 1 0   Afraid - awful might happen 0 0   Total GAD 7 Score 1 0  Anxiety Difficulty Not difficult at all      Data saved with a previous flowsheet row definition   Attestations:   Patient is establishing care in this system with me as PCP. Available records  reviewed. Chart updated today with reconciliation of problem list, medications, allergies, and relevant history. Preventive care and chronic disease status reviewed.  Outside labs reviewed and will be abstracted. Portions of historical chart may remain incomplete; will update on an ongoing basis as clinically indicated.   Geni Shutter, DO, MS (Nutrition), FAAFP, Dipl. ABOM Fellow, American Academy [employer] Diplomate, American Board of Obesity Medicine [facility] [address] [place], [place] [address] Dept: [phone] Fax: [phone]     [1]  Outpatient Medications Prior to Visit  Medication Sig   loratadine  (CLARITIN ) 10 MG tablet Take 1 tablet (10 mg total) by mouth daily. (Patient taking differently: Take 10 mg by  mouth daily as needed for allergies.)   [DISCONTINUED] amoxicillin  (AMOXIL ) 500 MG capsule Take 1 capsule (500 mg total) by mouth 3 (three) times daily.   No facility-administered medications prior to visit.  [2] No Known Allergies  "

## 2025-01-13 LAB — CERVICOVAGINAL ANCILLARY ONLY
Chlamydia: NEGATIVE
Comment: NEGATIVE
Comment: NEGATIVE
Comment: NORMAL
Neisseria Gonorrhea: NEGATIVE
Trichomonas: NEGATIVE

## 2025-01-13 LAB — HEPATITIS B SURFACE ANTIGEN: Hepatitis B Surface Ag: NONREACTIVE

## 2025-01-13 LAB — HIV ANTIBODY (ROUTINE TESTING W REFLEX)
HIV 1&2 Ab, 4th Generation: NONREACTIVE
HIV FINAL INTERPRETATION: NEGATIVE

## 2025-01-13 LAB — SYPHILIS: RPR W/REFLEX TO RPR TITER AND TREPONEMAL ANTIBODIES, TRADITIONAL SCREENING AND DIAGNOSIS ALGORITHM: RPR Ser Ql: NONREACTIVE

## 2025-01-13 LAB — BETA HCG QUANT (REF LAB): hCG Quant: <1 m[IU]/mL

## 2026-01-15 ENCOUNTER — Encounter: Admitting: Family Medicine
